# Patient Record
Sex: Male | Born: 1960 | Race: White | Hispanic: No | Marital: Single
Health system: Southern US, Community
[De-identification: ages and names within clinical notes are randomized; demographics above are authoritative.]

---

## 2017-09-11 DIAGNOSIS — H903 Sensorineural hearing loss, bilateral: Secondary | ICD-10-CM | POA: Insufficient documentation

## 2018-10-22 ENCOUNTER — Other Ambulatory Visit: Payer: Self-pay | Admitting: Gastroenterology

## 2018-10-22 DIAGNOSIS — R634 Abnormal weight loss: Secondary | ICD-10-CM

## 2018-10-22 DIAGNOSIS — R194 Change in bowel habit: Secondary | ICD-10-CM

## 2018-11-05 ENCOUNTER — Ambulatory Visit
Admission: RE | Admit: 2018-11-05 | Discharge: 2018-11-05 | Disposition: A | Discharge status: Home / Self Care | Payer: Self-pay | Source: Ambulatory Visit | Attending: Gastroenterology | Admitting: Gastroenterology

## 2018-11-05 DIAGNOSIS — R194 Change in bowel habit: Secondary | ICD-10-CM

## 2018-11-05 DIAGNOSIS — R634 Abnormal weight loss: Secondary | ICD-10-CM

## 2018-11-05 MED ORDER — IOPAMIDOL (ISOVUE-300) INJECTION 61%
100.0000 mL | Freq: Once | INTRAVENOUS | Status: AC | PRN
  Administered 2018-11-05: 100 mL via INTRAVENOUS

## 2021-10-09 ENCOUNTER — Emergency Department (HOSPITAL_COMMUNITY): Payer: 59

## 2021-10-09 ENCOUNTER — Encounter (HOSPITAL_COMMUNITY): Payer: Self-pay | Admitting: Emergency Medicine

## 2021-10-09 ENCOUNTER — Inpatient Hospital Stay (HOSPITAL_COMMUNITY)
Admission: EM | Admit: 2021-10-09 | Discharge: 2021-11-18 | DRG: 870 | Disposition: E | Payer: 59 | Attending: Pulmonary Disease | Admitting: Pulmonary Disease

## 2021-10-09 ENCOUNTER — Other Ambulatory Visit: Payer: Self-pay

## 2021-10-09 DIAGNOSIS — A4152 Sepsis due to Pseudomonas: Principal | ICD-10-CM | POA: Diagnosis present

## 2021-10-09 DIAGNOSIS — L89140 Pressure ulcer of left lower back, unstageable: Secondary | ICD-10-CM | POA: Diagnosis present

## 2021-10-09 DIAGNOSIS — J1282 Pneumonia due to coronavirus disease 2019: Secondary | ICD-10-CM | POA: Diagnosis present

## 2021-10-09 DIAGNOSIS — E876 Hypokalemia: Secondary | ICD-10-CM | POA: Diagnosis present

## 2021-10-09 DIAGNOSIS — J982 Interstitial emphysema: Secondary | ICD-10-CM | POA: Diagnosis not present

## 2021-10-09 DIAGNOSIS — R680 Hypothermia, not associated with low environmental temperature: Secondary | ICD-10-CM | POA: Diagnosis present

## 2021-10-09 DIAGNOSIS — J80 Acute respiratory distress syndrome: Secondary | ICD-10-CM | POA: Diagnosis present

## 2021-10-09 DIAGNOSIS — N179 Acute kidney failure, unspecified: Secondary | ICD-10-CM

## 2021-10-09 DIAGNOSIS — D62 Acute posthemorrhagic anemia: Secondary | ICD-10-CM | POA: Diagnosis not present

## 2021-10-09 DIAGNOSIS — K626 Ulcer of anus and rectum: Secondary | ICD-10-CM | POA: Diagnosis present

## 2021-10-09 DIAGNOSIS — R54 Age-related physical debility: Secondary | ICD-10-CM | POA: Diagnosis present

## 2021-10-09 DIAGNOSIS — R0902 Hypoxemia: Secondary | ICD-10-CM

## 2021-10-09 DIAGNOSIS — Z452 Encounter for adjustment and management of vascular access device: Secondary | ICD-10-CM

## 2021-10-09 DIAGNOSIS — T797XXA Traumatic subcutaneous emphysema, initial encounter: Secondary | ICD-10-CM

## 2021-10-09 DIAGNOSIS — G6281 Critical illness polyneuropathy: Secondary | ICD-10-CM | POA: Diagnosis present

## 2021-10-09 DIAGNOSIS — K5900 Constipation, unspecified: Secondary | ICD-10-CM | POA: Diagnosis present

## 2021-10-09 DIAGNOSIS — L89896 Pressure-induced deep tissue damage of other site: Secondary | ICD-10-CM | POA: Diagnosis present

## 2021-10-09 DIAGNOSIS — R64 Cachexia: Secondary | ICD-10-CM | POA: Diagnosis present

## 2021-10-09 DIAGNOSIS — D6959 Other secondary thrombocytopenia: Secondary | ICD-10-CM | POA: Diagnosis present

## 2021-10-09 DIAGNOSIS — E872 Acidosis, unspecified: Secondary | ICD-10-CM

## 2021-10-09 DIAGNOSIS — J9601 Acute respiratory failure with hypoxia: Secondary | ICD-10-CM | POA: Diagnosis present

## 2021-10-09 DIAGNOSIS — E861 Hypovolemia: Secondary | ICD-10-CM | POA: Diagnosis present

## 2021-10-09 DIAGNOSIS — L8990 Pressure ulcer of unspecified site, unspecified stage: Secondary | ICD-10-CM | POA: Diagnosis present

## 2021-10-09 DIAGNOSIS — R0600 Dyspnea, unspecified: Secondary | ICD-10-CM

## 2021-10-09 DIAGNOSIS — Z515 Encounter for palliative care: Secondary | ICD-10-CM

## 2021-10-09 DIAGNOSIS — J189 Pneumonia, unspecified organism: Secondary | ICD-10-CM

## 2021-10-09 DIAGNOSIS — R0989 Other specified symptoms and signs involving the circulatory and respiratory systems: Secondary | ICD-10-CM

## 2021-10-09 DIAGNOSIS — Z66 Do not resuscitate: Secondary | ICD-10-CM | POA: Diagnosis not present

## 2021-10-09 DIAGNOSIS — R6520 Severe sepsis without septic shock: Secondary | ICD-10-CM | POA: Diagnosis present

## 2021-10-09 DIAGNOSIS — I712 Thoracic aortic aneurysm, without rupture, unspecified: Secondary | ICD-10-CM | POA: Diagnosis present

## 2021-10-09 DIAGNOSIS — T7401XA Adult neglect or abandonment, confirmed, initial encounter: Secondary | ICD-10-CM

## 2021-10-09 DIAGNOSIS — Z4659 Encounter for fitting and adjustment of other gastrointestinal appliance and device: Secondary | ICD-10-CM

## 2021-10-09 DIAGNOSIS — A4189 Other specified sepsis: Secondary | ICD-10-CM | POA: Diagnosis present

## 2021-10-09 DIAGNOSIS — U071 COVID-19: Secondary | ICD-10-CM

## 2021-10-09 DIAGNOSIS — R7989 Other specified abnormal findings of blood chemistry: Secondary | ICD-10-CM

## 2021-10-09 DIAGNOSIS — L89220 Pressure ulcer of left hip, unstageable: Secondary | ICD-10-CM | POA: Diagnosis present

## 2021-10-09 DIAGNOSIS — A419 Sepsis, unspecified organism: Secondary | ICD-10-CM | POA: Diagnosis present

## 2021-10-09 DIAGNOSIS — D689 Coagulation defect, unspecified: Secondary | ICD-10-CM | POA: Diagnosis present

## 2021-10-09 DIAGNOSIS — E43 Unspecified severe protein-calorie malnutrition: Secondary | ICD-10-CM | POA: Diagnosis present

## 2021-10-09 DIAGNOSIS — L89150 Pressure ulcer of sacral region, unstageable: Secondary | ICD-10-CM | POA: Diagnosis present

## 2021-10-09 DIAGNOSIS — G9341 Metabolic encephalopathy: Secondary | ICD-10-CM | POA: Diagnosis present

## 2021-10-09 DIAGNOSIS — Z4931 Encounter for adequacy testing for hemodialysis: Secondary | ICD-10-CM

## 2021-10-09 DIAGNOSIS — I219 Acute myocardial infarction, unspecified: Secondary | ICD-10-CM | POA: Diagnosis not present

## 2021-10-09 DIAGNOSIS — L89212 Pressure ulcer of right hip, stage 2: Secondary | ICD-10-CM | POA: Diagnosis present

## 2021-10-09 DIAGNOSIS — K922 Gastrointestinal hemorrhage, unspecified: Secondary | ICD-10-CM | POA: Diagnosis present

## 2021-10-09 DIAGNOSIS — J9600 Acute respiratory failure, unspecified whether with hypoxia or hypercapnia: Secondary | ICD-10-CM

## 2021-10-09 DIAGNOSIS — J440 Chronic obstructive pulmonary disease with acute lower respiratory infection: Secondary | ICD-10-CM | POA: Diagnosis present

## 2021-10-09 DIAGNOSIS — L89130 Pressure ulcer of right lower back, unstageable: Secondary | ICD-10-CM | POA: Diagnosis present

## 2021-10-09 DIAGNOSIS — R627 Adult failure to thrive: Secondary | ICD-10-CM | POA: Diagnosis present

## 2021-10-09 DIAGNOSIS — Z681 Body mass index (BMI) 19 or less, adult: Secondary | ICD-10-CM

## 2021-10-09 DIAGNOSIS — R6521 Severe sepsis with septic shock: Secondary | ICD-10-CM | POA: Diagnosis not present

## 2021-10-09 DIAGNOSIS — E860 Dehydration: Secondary | ICD-10-CM | POA: Diagnosis present

## 2021-10-09 DIAGNOSIS — K921 Melena: Secondary | ICD-10-CM | POA: Diagnosis not present

## 2021-10-09 DIAGNOSIS — Z28310 Unvaccinated for covid-19: Secondary | ICD-10-CM

## 2021-10-09 DIAGNOSIS — J151 Pneumonia due to Pseudomonas: Secondary | ICD-10-CM | POA: Diagnosis present

## 2021-10-09 DIAGNOSIS — E870 Hyperosmolality and hypernatremia: Secondary | ICD-10-CM

## 2021-10-09 DIAGNOSIS — Z9289 Personal history of other medical treatment: Secondary | ICD-10-CM

## 2021-10-09 DIAGNOSIS — Z978 Presence of other specified devices: Secondary | ICD-10-CM

## 2021-10-09 LAB — CBC WITH DIFFERENTIAL/PLATELET
Abs Immature Granulocytes: 0.33 10*3/uL — ABNORMAL HIGH (ref 0.00–0.07)
Basophils Absolute: 0.2 10*3/uL — ABNORMAL HIGH (ref 0.0–0.1)
Basophils Relative: 1 %
Eosinophils Absolute: 0 10*3/uL (ref 0.0–0.5)
Eosinophils Relative: 0 %
HCT: 56.1 % — ABNORMAL HIGH (ref 39.0–52.0)
Hemoglobin: 18.6 g/dL — ABNORMAL HIGH (ref 13.0–17.0)
Immature Granulocytes: 1 %
Lymphocytes Relative: 3 %
Lymphs Abs: 0.8 10*3/uL (ref 0.7–4.0)
MCH: 29.9 pg (ref 26.0–34.0)
MCHC: 33.2 g/dL (ref 30.0–36.0)
MCV: 90.2 fL (ref 80.0–100.0)
Monocytes Absolute: 1.5 10*3/uL — ABNORMAL HIGH (ref 0.1–1.0)
Monocytes Relative: 6 %
Neutro Abs: 22.7 10*3/uL — ABNORMAL HIGH (ref 1.7–7.7)
Neutrophils Relative %: 89 %
Platelets: 623 10*3/uL — ABNORMAL HIGH (ref 150–400)
RBC: 6.22 MIL/uL — ABNORMAL HIGH (ref 4.22–5.81)
RDW: 16.3 % — ABNORMAL HIGH (ref 11.5–15.5)
WBC: 25.5 10*3/uL — ABNORMAL HIGH (ref 4.0–10.5)
nRBC: 0.1 % (ref 0.0–0.2)

## 2021-10-09 LAB — RAPID URINE DRUG SCREEN, HOSP PERFORMED
Amphetamines: NOT DETECTED
Barbiturates: NOT DETECTED
Benzodiazepines: NOT DETECTED
Cocaine: NOT DETECTED
Opiates: NOT DETECTED
Tetrahydrocannabinol: NOT DETECTED

## 2021-10-09 LAB — MAGNESIUM: Magnesium: 3.7 mg/dL — ABNORMAL HIGH (ref 1.7–2.4)

## 2021-10-09 LAB — LACTIC ACID, PLASMA
Lactic Acid, Venous: 7.7 mmol/L (ref 0.5–1.9)
Lactic Acid, Venous: 6.4 mmol/L (ref 0.5–1.9)
Lactic Acid, Venous: 3.5 mmol/L (ref 0.5–1.9)

## 2021-10-09 LAB — PROTIME-INR
INR: 1.3 — ABNORMAL HIGH (ref 0.8–1.2)
Prothrombin Time: 16.5 seconds — ABNORMAL HIGH (ref 11.4–15.2)

## 2021-10-09 LAB — RESP PANEL BY RT-PCR (FLU A&B, COVID) ARPGX2
Influenza A by PCR: NEGATIVE
Influenza B by PCR: NEGATIVE
SARS Coronavirus 2 by RT PCR: POSITIVE — AB

## 2021-10-09 LAB — COMPREHENSIVE METABOLIC PANEL
ALT: 89 U/L — ABNORMAL HIGH (ref 0–44)
AST: 213 U/L — ABNORMAL HIGH (ref 15–41)
Albumin: 3.3 g/dL — ABNORMAL LOW (ref 3.5–5.0)
Alkaline Phosphatase: 86 U/L (ref 38–126)
Anion gap: 23 — ABNORMAL HIGH (ref 5–15)
BUN: 126 mg/dL — ABNORMAL HIGH (ref 8–23)
CO2: 14 mmol/L — ABNORMAL LOW (ref 22–32)
Calcium: 9.7 mg/dL (ref 8.9–10.3)
Chloride: 114 mmol/L — ABNORMAL HIGH (ref 98–111)
Creatinine, Ser: 3.54 mg/dL — ABNORMAL HIGH (ref 0.61–1.24)
GFR, Estimated: 19 mL/min — ABNORMAL LOW (ref >60)
Glucose, Bld: 142 mg/dL — ABNORMAL HIGH (ref 70–99)
Potassium: 4.5 mmol/L (ref 3.5–5.1)
Sodium: 151 mmol/L — ABNORMAL HIGH (ref 135–145)
Total Bilirubin: 1.7 mg/dL — ABNORMAL HIGH (ref 0.3–1.2)
Total Protein: 7.8 g/dL (ref 6.5–8.1)

## 2021-10-09 LAB — URINALYSIS, ROUTINE W REFLEX MICROSCOPIC
Bacteria, UA: NONE SEEN
Bilirubin Urine: NEGATIVE
Glucose, UA: NEGATIVE mg/dL
Ketones, ur: NEGATIVE mg/dL
Leukocytes,Ua: NEGATIVE
Nitrite: NEGATIVE
Protein, ur: NEGATIVE mg/dL
Specific Gravity, Urine: 1.02 (ref 1.005–1.030)
pH: 5 (ref 5.0–8.0)

## 2021-10-09 LAB — BRAIN NATRIURETIC PEPTIDE: B Natriuretic Peptide: 165.3 pg/mL — ABNORMAL HIGH (ref 0.0–100.0)

## 2021-10-09 LAB — D-DIMER, QUANTITATIVE: D-Dimer, Quant: 4.97 ug/mL-FEU — ABNORMAL HIGH (ref 0.00–0.50)

## 2021-10-09 MED ORDER — VANCOMYCIN HCL IN DEXTROSE 1-5 GM/200ML-% IV SOLN
1000.0000 mg | Freq: Once | INTRAVENOUS | Status: AC
  Administered 2021-10-09: 1000 mg via INTRAVENOUS
  Filled 2021-10-09: qty 200

## 2021-10-09 MED ORDER — LACTATED RINGERS IV SOLN: INTRAVENOUS | Status: DC

## 2021-10-09 MED ORDER — SODIUM CHLORIDE 0.9 % IV SOLN
2.0000 g | Freq: Once | INTRAVENOUS | Status: AC
  Administered 2021-10-09: 2 g via INTRAVENOUS
  Filled 2021-10-09: qty 2

## 2021-10-09 MED ORDER — LACTATED RINGERS IV BOLUS (SEPSIS)
250.0000 mL | Freq: Once | INTRAVENOUS | Status: AC
  Administered 2021-10-09: 250 mL via INTRAVENOUS

## 2021-10-09 MED ORDER — METRONIDAZOLE 500 MG/100ML IV SOLN
500.0000 mg | Freq: Once | INTRAVENOUS | Status: AC
  Administered 2021-10-09: 500 mg via INTRAVENOUS
  Filled 2021-10-09: qty 100

## 2021-10-09 MED ORDER — LACTATED RINGERS IV BOLUS (SEPSIS)
1000.0000 mL | Freq: Once | INTRAVENOUS | Status: AC
  Administered 2021-10-09: 1000 mL via INTRAVENOUS

## 2021-10-09 MED ORDER — LACTATED RINGERS IV BOLUS (SEPSIS)
500.0000 mL | Freq: Once | INTRAVENOUS | Status: AC
  Administered 2021-10-09: 500 mL via INTRAVENOUS

## 2021-10-09 NOTE — ED Notes (Signed)
Patients family member called for an update: [name] [phone]

## 2021-10-09 NOTE — ED Provider Notes (Signed)
[facility] DEPT Provider Note   CSN: [id number] Arrival date & time: 09/29/2021  1757     History  Chief Complaint  Patient presents with   Code Sepsis    Brandon Bond is a 61 y.o. male.  HPI Patient presents for generalized weakness and altered mental status.  Medical history includes anxiety and depression.  He had recent exposure to COVID-19.  He arrives in the ED via EMS from home.  EMS noted a disheveled home environment.  Patient was found in a recliner soiled in urine and stool.  His partner, who he lives with, stated that he was up and walking yesterday.  Initial vital signs on scene were notable for SPO2 in the 70s on room air, tachypnea, tachycardia, and normal blood pressure.  He was given albuterol and IV fluids during transit.  Patient is unable to provide any further history due to altered mental status.     Home Medications Prior to Admission medications   Not on File      Allergies    Patient has no known allergies.    Review of Systems   Review of Systems  Unable to perform ROS: Mental status change   Physical Exam Updated Vital Signs BP (!) 126/97    Pulse 76    Temp 98 F (36.7 C)    Resp (!) 26    Ht 5\' 11"  (1.803 m)    Wt 63.5 kg    SpO2 92% Comment: Respiratory at bedside setting up humidified oxygen   BMI 19.53 kg/m  Physical Exam Constitutional:      General: He is awake.     Appearance: He is cachectic. He is ill-appearing.     Comments: Disheveled, soiled in urine and stool  HENT:     Head: Atraumatic.     Right Ear: External ear normal.     Left Ear: External ear normal.     Nose: Congestion and rhinorrhea present.     Mouth/Throat:     Mouth: Mucous membranes are dry.     Pharynx: Oropharynx is clear.  Eyes:     General: No scleral icterus. Cardiovascular:     Rate and Rhythm: Regular rhythm. Tachycardia present.  Pulmonary:     Effort: Tachypnea present.     Breath sounds: Transmitted upper airway  sounds present. No stridor. Rhonchi present. No decreased breath sounds, wheezing or rales.  Abdominal:     General: Abdomen is flat.     Palpations: Abdomen is soft.     Tenderness: There is no abdominal tenderness.  Musculoskeletal:        General: No deformity.     Cervical back: No rigidity or tenderness.     Right lower leg: Edema present.     Left lower leg: Edema present.  Skin:    Comments: Redness and tenderness to lower half of back, hips, and medial/posterior elbows, consistent with contact dermatitis from laying in soiled recliner  Neurological:     GCS: GCS eye subscore is 4. GCS verbal subscore is 4. GCS motor subscore is 6.     Cranial Nerves: No facial asymmetry.  Psychiatric:        Mood and Affect: Mood is anxious.        Behavior: Behavior is uncooperative and agitated.    ED Results / Procedures / Treatments   Labs (all labs ordered are listed, but only abnormal results are displayed) Labs Reviewed  RESP PANEL BY RT-PCR (FLU A&B,  COVID) ARPGX2 - Abnormal; Notable for the following components:      Result Value   SARS Coronavirus 2 by RT PCR POSITIVE (*)    All other components within normal limits  LACTIC ACID, PLASMA - Abnormal; Notable for the following components:   Lactic Acid, Venous 7.7 (*)    All other components within normal limits  LACTIC ACID, PLASMA - Abnormal; Notable for the following components:   Lactic Acid, Venous 6.4 (*)    All other components within normal limits  COMPREHENSIVE METABOLIC PANEL - Abnormal; Notable for the following components:   Sodium 151 (*)    Chloride 114 (*)    CO2 14 (*)    Glucose, Bld 142 (*)    BUN 126 (*)    Creatinine, Ser 3.54 (*)    Albumin 3.3 (*)    AST 213 (*)    ALT 89 (*)    Total Bilirubin 1.7 (*)    GFR, Estimated 19 (*)    Anion gap 23 (*)    All other components within normal limits  CBC WITH DIFFERENTIAL/PLATELET - Abnormal; Notable for the following components:   WBC 25.5 (*)    RBC 6.22  (*)    Hemoglobin 18.6 (*)    HCT 56.1 (*)    RDW 16.3 (*)    Platelets 623 (*)    Neutro Abs 22.7 (*)    Monocytes Absolute 1.5 (*)    Basophils Absolute 0.2 (*)    Abs Immature Granulocytes 0.33 (*)    All other components within normal limits  PROTIME-INR - Abnormal; Notable for the following components:   Prothrombin Time 16.5 (*)    INR 1.3 (*)    All other components within normal limits  URINALYSIS, ROUTINE W REFLEX MICROSCOPIC - Abnormal; Notable for the following components:   Color, Urine AMBER (*)    Hgb urine dipstick SMALL (*)    All other components within normal limits  BRAIN NATRIURETIC PEPTIDE - Abnormal; Notable for the following components:   B Natriuretic Peptide 165.3 (*)    All other components within normal limits  D-DIMER, QUANTITATIVE - Abnormal; Notable for the following components:   D-Dimer, Quant 4.97 (*)    All other components within normal limits  MAGNESIUM - Abnormal; Notable for the following components:   Magnesium 3.7 (*)    All other components within normal limits  LACTIC ACID, PLASMA - Abnormal; Notable for the following components:   Lactic Acid, Venous 3.5 (*)    All other components within normal limits  LACTIC ACID, PLASMA - Abnormal; Notable for the following components:   Lactic Acid, Venous 2.5 (*)    All other components within normal limits  CULTURE, BLOOD (ROUTINE X 2)  CULTURE, BLOOD (ROUTINE X 2)  URINE CULTURE  RAPID URINE DRUG SCREEN, HOSP PERFORMED  HIV ANTIBODY (ROUTINE TESTING W REFLEX)  COMPREHENSIVE METABOLIC PANEL  CBC WITH DIFFERENTIAL/PLATELET  MAGNESIUM    EKG EKG Interpretation  Date/Time:  Monday October 09 2021 18:25:14 EST Ventricular Rate:  118 PR Interval:  167 QRS Duration: 92 QT Interval:  306 QTC Calculation: 429 R Axis:   77 Text Interpretation: Sinus tachycardia Biatrial enlargement RSR' in V1 or V2, probably normal variant Probable inferior infarct, old Confirmed by Godfrey Pick [phone]) on  10/14/2021 6:58:10 PM  Radiology CT Head Wo Contrast  Result Date: 10/13/2021 CLINICAL DATA:  Altered mental status EXAM: CT HEAD WITHOUT CONTRAST TECHNIQUE: Contiguous axial images were obtained from the base  of the skull through the vertex without intravenous contrast. RADIATION DOSE REDUCTION: This exam was performed according to the departmental dose-optimization program which includes automated exposure control, adjustment of the mA and/or kV according to patient size and/or use of iterative reconstruction technique. COMPARISON:  None. FINDINGS: Brain: No evidence of acute infarction, hemorrhage, hydrocephalus, extra-axial collection or mass lesion/mass effect. Vascular: No hyperdense vessel or unexpected calcification. Skull: Normal. Negative for fracture or focal lesion. Sinuses/Orbits: No acute finding. Other: None. IMPRESSION: No acute intracranial abnormality noted. Electronically Signed   By: Inez Catalina M.D.   On: 10/17/2021 19:16   DG Chest Port 1 View  Result Date: 10/07/2021 CLINICAL DATA:  Possible sepsis EXAM: PORTABLE CHEST 1 VIEW COMPARISON:  03/22/2009 FINDINGS: Cardiac shadow is within normal limits. The lungs are well aerated bilaterally. Bibasilar medial airspace opacities are noted distant with multifocal pneumonia. No sizable effusion is seen. No bony abnormality is noted. IMPRESSION: Bibasilar infiltrates Electronically Signed   By: Inez Catalina M.D.   On: 10/05/2021 19:27   CT CHEST ABDOMEN PELVIS WO CONTRAST  Result Date: 10/12/2021 CLINICAL DATA:  Possible sepsis, bibasilar infiltrates on recent chest x-ray EXAM: CT CHEST, ABDOMEN AND PELVIS WITHOUT CONTRAST TECHNIQUE: Multidetector CT imaging of the chest, abdomen and pelvis was performed following the standard protocol without IV contrast. RADIATION DOSE REDUCTION: This exam was performed according to the departmental dose-optimization program which includes automated exposure control, adjustment of the mA and/or kV  according to patient size and/or use of iterative reconstruction technique. COMPARISON:  Chest x-ray from earlier in the same day. FINDINGS: CT CHEST FINDINGS Cardiovascular: Limited due to lack of IV contrast. Atherosclerotic calcifications of the thoracic aorta are noted. Ascending aorta is mildly prominent at 4.4 cm. Coronary calcifications are noted. No cardiac enlargement is noted. Mediastinum/Nodes: Thoracic inlet is within normal limits. No sizable hilar or mediastinal adenopathy is noted. The esophagus is within normal limits. Lungs/Pleura: Lungs are well aerated bilaterally with the exception of bilateral lower lobe infiltrates similar to that seen on prior chest x-ray. No effusions are noted. No parenchymal nodules are seen. Musculoskeletal: Degenerative changes of the thoracic spine are noted. CT ABDOMEN PELVIS FINDINGS Hepatobiliary: No focal liver abnormality is seen. No gallstones, gallbladder wall thickening, or biliary dilatation. Pancreas: Unremarkable. No pancreatic ductal dilatation or surrounding inflammatory changes. Spleen: Normal in size without focal abnormality. Adrenals/Urinary Tract: Adrenal glands are within normal limits. Kidneys demonstrate no renal calculi or obstructive changes. Bladder is decompressed by Foley catheter. Stomach/Bowel: The appendix is within normal limits. No obstructive or inflammatory changes of the colon are noted. Fecal material is noted throughout the colon particularly in the rectal vault which may represent some early constipation. Small bowel and stomach are within normal limits. Vascular/Lymphatic: Aortic atherosclerosis. No enlarged abdominal or pelvic lymph nodes. Reproductive: Prostate is unremarkable. Other: No abdominal wall hernia or abnormality. No abdominopelvic ascites. Musculoskeletal: Degenerative changes of lumbar spine are noted. IMPRESSION: CT of the chest: Bilateral lower lobe infiltrates similar to that seen on recent chest x-ray. These  changes may be related to aspiration. Prominent thoracic aortic aneurysm at 4.4 cm. Recommend annual imaging followup by CTA or MRA. This recommendation follows 2010 ACCF/AHA/AATS/ACR/ASA/SCA/SCAI/SIR/STS/SVM Guidelines for the Diagnosis and Management of Patients with Thoracic Aortic Disease. Circulation. 2010; [id number]. Aortic aneurysm NOS (ICD10-I71.9) CT of the abdomen and pelvis: Mild fecal retention particularly in the rectal vault. No other focal abnormality is noted. Electronically Signed   By: Inez Catalina M.D.   On: 10/06/2021  22:37    Procedures Procedures    Medications Ordered in ED Medications  dexamethasone (DECADRON) injection 6 mg (6 mg Intravenous Given 10/10/21 0100)  dextrose 5 % solution ( Intravenous New Bag/Given 10/10/21 0104)  heparin injection 5,000 Units (5,000 Units Subcutaneous Given 10/10/21 0105)  acetaminophen (TYLENOL) suppository 650 mg (has no administration in time range)  ondansetron (ZOFRAN) injection 4 mg (has no administration in time range)  remdesivir 100 mg in sodium chloride 0.9 % 100 mL IVPB (100 mg Intravenous New Bag/Given 10/10/21 0055)    Followed by  remdesivir 100 mg in sodium chloride 0.9 % 100 mL IVPB (has no administration in time range)    Followed by  remdesivir 100 mg in sodium chloride 0.9 % 100 mL IVPB (has no administration in time range)  ceFEPIme (MAXIPIME) 2 g in sodium chloride 0.9 % 100 mL IVPB (has no administration in time range)  vancomycin variable dose per unstable renal function (pharmacist dosing) (has no administration in time range)  lactated ringers bolus 1,000 mL (0 mLs Intravenous Stopped 09/25/2021 2010)    And  lactated ringers bolus 500 mL (0 mLs Intravenous Stopped 10/12/2021 2336)    And  lactated ringers bolus 250 mL (0 mLs Intravenous Stopped 09/27/2021 2336)  ceFEPIme (MAXIPIME) 2 g in sodium chloride 0.9 % 100 mL IVPB (0 g Intravenous Stopped 09/24/2021 1915)  metroNIDAZOLE (FLAGYL) IVPB 500 mg (0 mg Intravenous  Stopped 09/28/2021 1946)  vancomycin (VANCOCIN) IVPB 1000 mg/200 mL premix (0 mg Intravenous Stopped 09/30/2021 1959)    ED Course/ Medical Decision Making/ A&P                           Medical Decision Making Amount and/or Complexity of Data Reviewed Labs: ordered. Radiology: ordered. ECG/medicine tests: ordered.  Risk Prescription drug management. Decision regarding hospitalization.   This patient presents to the ED for concern of altered mental status, this involves an extensive number of treatment options, and is a complaint that carries with it a high risk of complications and morbidity.  The differential diagnosis includes sepsis, CVA, polypharmacy, drug withdrawal, uremia, dehydration   Co morbidities that complicate the patient evaluation  Anxiety, depression   Additional history obtained:  Additional history obtained from EMS External records from outside source obtained and reviewed including EMR   Lab Tests:  I Ordered, and personally interpreted labs.  The pertinent results include: AKI, severe azotemia, metabolic acidosis, lactic acidosis, hypernatremia, leukocytosis, polycythemia and thrombocytosis consistent with hemoconcentration, elevated D-dimer, COVID-19 positive   Imaging Studies ordered:  I ordered imaging studies including chest x-ray, CT head, CT of chest, abdomen, and pelvis I independently visualized and interpreted imaging which showed the following: No acute abnormalities identified on CT head.  CT of chest/abdomen/pelvis showed bilateral lower lung lobe infiltrates I agree with the radiologist interpretation   Cardiac Monitoring:  The patient was maintained on a cardiac monitor.  I personally viewed and interpreted the cardiac monitored which showed an underlying rhythm of: Sinus rhythm   Medicines ordered and prescription drug management:  I ordered medication including IV fluids and broad-spectrum antibiotics for empiric treatment of  sepsis Reevaluation of the patient after these medicines showed that the patient improved I have reviewed the patients home medicines and have made adjustments as needed  Critical Interventions:  IV fluids and broad-spectrum antibiotics for empiric treatment of sepsis  Problem List / ED Course:  61 year old male presenting for altered mental status.  On arrival, patient is severely ill-appearing.  He is soiled in stool and urine.  He has areas of redness on his back and medial elbows that I suspect is contact dermatitis from prolonged sitting in his own urine.  He has pressure ulcers to his backside.  There is a small ulceration of his scrotum.  He is disheveled and cachectic.  His vital signs are notable for hypothermia, tachycardia, hypoxia, tachypnea.  He is maintaining normal blood pressures.  Patient was cleaned on arrival.  He was placed on supplemental oxygen.  IV fluids and broad-spectrum antibiotics were given for empiric treatment of sepsis.  With IV fluid resuscitation, patient's vital signs improved.  He was weaned down to 3 L of supplemental oxygen.  Although patient does not have prior lab work for comparison, lab work today is consistent with prerenal AKI with markedly elevated BUN, which is likely, in part, contributing to his altered mental state.  CMP also notable for hyponatremia and elevated liver enzymes.  CBC shows elevation in WBC, hemoglobin, and platelets, consistent with hemoconcentration.  Patient's initial lactic acid was 7.7, consistent with severe illness.  He is found to be COVID-19 positive.  Patient underwent imaging studies which was notable for bibasilar lung infiltrates.  I spoke with his partner, who he lives with.  She did reiterate that he was up and walking yesterday.  She states that he was in his recliner since last night.  She states that he has not left the house in several months due to what she suspects is severe depression.  He has not been taking any  medications.  He does have a history of alcohol use but has not drank in years.  He does not use illicit drugs.   Reevaluation:  After the interventions noted above, I reevaluated the patient and found that they have :improved   Social Determinants of Health:  History of depression, malnutrition, uncertain social situation   Dispostion:  After consideration of the diagnostic results and the patients response to treatment, I feel that the patent would benefit from admission.   CRITICAL CARE Performed by: Godfrey Pick   Total critical care time: 42 minutes  Critical care time was exclusive of separately billable procedures and treating other patients.  Critical care was necessary to treat or prevent imminent or life-threatening deterioration.  Critical care was time spent personally by me on the following activities: development of treatment plan with patient and/or surrogate as well as nursing, discussions with consultants, evaluation of patient's response to treatment, examination of patient, obtaining history from patient or surrogate, ordering and performing treatments and interventions, ordering and review of laboratory studies, ordering and review of radiographic studies, pulse oximetry and re-evaluation of patient's condition.         Final Clinical Impression(s) / ED Diagnoses Final diagnoses:  AKI (acute kidney injury) ([facility])  Azotemia  Lactic acidosis  COVID-19  Hypernatremia    Rx / DC Orders ED Discharge Orders     None         Godfrey Pick, MD 10/10/21 [id number]

## 2021-10-09 NOTE — ED Triage Notes (Signed)
Patient presents from home due to not eating moving and altered mental status the past few days. Patient admits to being Covid positive for 2 days. Rn noted sore all over his body as well as malodorous urine. EMS reports wheezing in the right lung and sats in the 80's. 5 of albuterol was given as well as 300 ml of fluid. Post treatment SPO2 increased to 92%.  HX depression, anxiety  EMS vitals: 105/80 BP 120 HR 32 RR 188 CBG 92 %  SPO2 on room air

## 2021-10-09 NOTE — Progress Notes (Signed)
Elink following code sepsis °

## 2021-10-09 NOTE — Progress Notes (Signed)
A consult was received from an ED physician for Vancomycin + Cefepime per pharmacy dosing.  The patient's profile has been reviewed for ht/wt/allergies/indication/available labs.   A one time order has been placed for Cefepime 2gm IV & Vancomycin 1gm IV.  Further antibiotics/pharmacy consults should be ordered by admitting physician if indicated.                       Thank you, Junita Push PharmD 10/08/2021  6:15 PM

## 2021-10-10 ENCOUNTER — Encounter (HOSPITAL_COMMUNITY): Payer: Self-pay | Admitting: Internal Medicine

## 2021-10-10 ENCOUNTER — Other Ambulatory Visit: Payer: Self-pay

## 2021-10-10 DIAGNOSIS — E872 Acidosis, unspecified: Secondary | ICD-10-CM | POA: Diagnosis present

## 2021-10-10 DIAGNOSIS — A419 Sepsis, unspecified organism: Secondary | ICD-10-CM

## 2021-10-10 DIAGNOSIS — E870 Hyperosmolality and hypernatremia: Secondary | ICD-10-CM | POA: Diagnosis present

## 2021-10-10 DIAGNOSIS — R6521 Severe sepsis with septic shock: Secondary | ICD-10-CM | POA: Diagnosis not present

## 2021-10-10 DIAGNOSIS — J440 Chronic obstructive pulmonary disease with acute lower respiratory infection: Secondary | ICD-10-CM | POA: Diagnosis present

## 2021-10-10 DIAGNOSIS — U071 COVID-19: Secondary | ICD-10-CM | POA: Diagnosis not present

## 2021-10-10 DIAGNOSIS — J151 Pneumonia due to Pseudomonas: Secondary | ICD-10-CM | POA: Diagnosis present

## 2021-10-10 DIAGNOSIS — Z681 Body mass index (BMI) 19 or less, adult: Secondary | ICD-10-CM | POA: Diagnosis not present

## 2021-10-10 DIAGNOSIS — R6520 Severe sepsis without septic shock: Secondary | ICD-10-CM | POA: Diagnosis not present

## 2021-10-10 DIAGNOSIS — R7401 Elevation of levels of liver transaminase levels: Secondary | ICD-10-CM | POA: Diagnosis not present

## 2021-10-10 DIAGNOSIS — E43 Unspecified severe protein-calorie malnutrition: Secondary | ICD-10-CM | POA: Diagnosis not present

## 2021-10-10 DIAGNOSIS — J80 Acute respiratory distress syndrome: Secondary | ICD-10-CM | POA: Diagnosis present

## 2021-10-10 DIAGNOSIS — J189 Pneumonia, unspecified organism: Secondary | ICD-10-CM | POA: Diagnosis not present

## 2021-10-10 DIAGNOSIS — I712 Thoracic aortic aneurysm, without rupture, unspecified: Secondary | ICD-10-CM | POA: Diagnosis present

## 2021-10-10 DIAGNOSIS — D689 Coagulation defect, unspecified: Secondary | ICD-10-CM | POA: Diagnosis present

## 2021-10-10 DIAGNOSIS — J9600 Acute respiratory failure, unspecified whether with hypoxia or hypercapnia: Secondary | ICD-10-CM | POA: Diagnosis not present

## 2021-10-10 DIAGNOSIS — G6281 Critical illness polyneuropathy: Secondary | ICD-10-CM | POA: Diagnosis present

## 2021-10-10 DIAGNOSIS — G9341 Metabolic encephalopathy: Secondary | ICD-10-CM | POA: Diagnosis present

## 2021-10-10 DIAGNOSIS — R64 Cachexia: Secondary | ICD-10-CM | POA: Diagnosis present

## 2021-10-10 DIAGNOSIS — T7401XA Adult neglect or abandonment, confirmed, initial encounter: Secondary | ICD-10-CM | POA: Diagnosis not present

## 2021-10-10 DIAGNOSIS — Z515 Encounter for palliative care: Secondary | ICD-10-CM | POA: Diagnosis not present

## 2021-10-10 DIAGNOSIS — E860 Dehydration: Secondary | ICD-10-CM | POA: Insufficient documentation

## 2021-10-10 DIAGNOSIS — A4152 Sepsis due to Pseudomonas: Secondary | ICD-10-CM | POA: Diagnosis present

## 2021-10-10 DIAGNOSIS — J9601 Acute respiratory failure with hypoxia: Secondary | ICD-10-CM | POA: Diagnosis not present

## 2021-10-10 DIAGNOSIS — R627 Adult failure to thrive: Secondary | ICD-10-CM | POA: Diagnosis not present

## 2021-10-10 DIAGNOSIS — E876 Hypokalemia: Secondary | ICD-10-CM | POA: Diagnosis not present

## 2021-10-10 DIAGNOSIS — Z66 Do not resuscitate: Secondary | ICD-10-CM | POA: Diagnosis not present

## 2021-10-10 DIAGNOSIS — R0609 Other forms of dyspnea: Secondary | ICD-10-CM | POA: Diagnosis not present

## 2021-10-10 DIAGNOSIS — A4189 Other specified sepsis: Secondary | ICD-10-CM | POA: Diagnosis present

## 2021-10-10 DIAGNOSIS — K626 Ulcer of anus and rectum: Secondary | ICD-10-CM | POA: Diagnosis present

## 2021-10-10 DIAGNOSIS — K921 Melena: Secondary | ICD-10-CM | POA: Diagnosis not present

## 2021-10-10 DIAGNOSIS — D62 Acute posthemorrhagic anemia: Secondary | ICD-10-CM | POA: Diagnosis not present

## 2021-10-10 DIAGNOSIS — N179 Acute kidney failure, unspecified: Secondary | ICD-10-CM | POA: Diagnosis not present

## 2021-10-10 DIAGNOSIS — J1282 Pneumonia due to coronavirus disease 2019: Secondary | ICD-10-CM | POA: Diagnosis present

## 2021-10-10 DIAGNOSIS — I219 Acute myocardial infarction, unspecified: Secondary | ICD-10-CM | POA: Diagnosis not present

## 2021-10-10 DIAGNOSIS — Z7189 Other specified counseling: Secondary | ICD-10-CM | POA: Diagnosis not present

## 2021-10-10 DIAGNOSIS — K922 Gastrointestinal hemorrhage, unspecified: Secondary | ICD-10-CM | POA: Diagnosis not present

## 2021-10-10 HISTORY — DX: Adult neglect or abandonment, confirmed, initial encounter: T74.01XA

## 2021-10-10 HISTORY — DX: Dehydration: E86.0

## 2021-10-10 HISTORY — DX: Hyperosmolality and hypernatremia: E87.0

## 2021-10-10 LAB — COMPREHENSIVE METABOLIC PANEL
ALT: 139 U/L — ABNORMAL HIGH (ref 0–44)
AST: <5 U/L — ABNORMAL LOW (ref 15–41)
Albumin: 2.5 g/dL — ABNORMAL LOW (ref 3.5–5.0)
Alkaline Phosphatase: 63 U/L (ref 38–126)
Anion gap: 12 (ref 5–15)
BUN: 136 mg/dL — ABNORMAL HIGH (ref 8–23)
CO2: 19 mmol/L — ABNORMAL LOW (ref 22–32)
Calcium: 8.7 mg/dL — ABNORMAL LOW (ref 8.9–10.3)
Chloride: 116 mmol/L — ABNORMAL HIGH (ref 98–111)
Creatinine, Ser: 2.77 mg/dL — ABNORMAL HIGH (ref 0.61–1.24)
GFR, Estimated: 25 mL/min — ABNORMAL LOW (ref >60)
Glucose, Bld: 128 mg/dL — ABNORMAL HIGH (ref 70–99)
Potassium: 3.4 mmol/L — ABNORMAL LOW (ref 3.5–5.1)
Sodium: 147 mmol/L — ABNORMAL HIGH (ref 135–145)
Total Bilirubin: 1.5 mg/dL — ABNORMAL HIGH (ref 0.3–1.2)
Total Protein: 5.6 g/dL — ABNORMAL LOW (ref 6.5–8.1)

## 2021-10-10 LAB — CBC WITH DIFFERENTIAL/PLATELET
Abs Immature Granulocytes: 0.23 10*3/uL — ABNORMAL HIGH (ref 0.00–0.07)
Basophils Absolute: 0.2 10*3/uL — ABNORMAL HIGH (ref 0.0–0.1)
Basophils Relative: 1 %
Eosinophils Absolute: 0 10*3/uL (ref 0.0–0.5)
Eosinophils Relative: 0 %
HCT: 44.8 % (ref 39.0–52.0)
Hemoglobin: 15 g/dL (ref 13.0–17.0)
Immature Granulocytes: 1 %
Lymphocytes Relative: 4 %
Lymphs Abs: 0.8 10*3/uL (ref 0.7–4.0)
MCH: 30.2 pg (ref 26.0–34.0)
MCHC: 33.5 g/dL (ref 30.0–36.0)
MCV: 90.3 fL (ref 80.0–100.0)
Monocytes Absolute: 0.7 10*3/uL (ref 0.1–1.0)
Monocytes Relative: 3 %
Neutro Abs: 19.1 10*3/uL — ABNORMAL HIGH (ref 1.7–7.7)
Neutrophils Relative %: 91 %
Platelets: 391 10*3/uL (ref 150–400)
RBC: 4.96 MIL/uL (ref 4.22–5.81)
RDW: 15 % (ref 11.5–15.5)
WBC: 21 10*3/uL — ABNORMAL HIGH (ref 4.0–10.5)
nRBC: 0.1 % (ref 0.0–0.2)

## 2021-10-10 LAB — HIV ANTIBODY (ROUTINE TESTING W REFLEX): HIV Screen 4th Generation wRfx: NONREACTIVE

## 2021-10-10 LAB — C-REACTIVE PROTEIN: CRP: 21.5 mg/dL — ABNORMAL HIGH (ref <1.0)

## 2021-10-10 LAB — MRSA NEXT GEN BY PCR, NASAL: MRSA by PCR Next Gen: NOT DETECTED

## 2021-10-10 LAB — LACTIC ACID, PLASMA: Lactic Acid, Venous: 2.5 mmol/L (ref 0.5–1.9)

## 2021-10-10 LAB — MAGNESIUM: Magnesium: 2.7 mg/dL — ABNORMAL HIGH (ref 1.7–2.4)

## 2021-10-10 LAB — PROCALCITONIN: Procalcitonin: 2.43 ng/mL

## 2021-10-10 LAB — STREP PNEUMONIAE URINARY ANTIGEN: Strep Pneumo Urinary Antigen: NEGATIVE

## 2021-10-10 MED ORDER — MUPIROCIN 2 % EX OINT
1.0000 "application " | TOPICAL_OINTMENT | Freq: Two times a day (BID) | CUTANEOUS | Status: DC
  Administered 2021-10-10 – 2021-10-12 (×6): 1 via NASAL
  Filled 2021-10-10 (×3): qty 22

## 2021-10-10 MED ORDER — SODIUM CHLORIDE 0.9 % IV SOLN
100.0000 mg | Freq: Once | INTRAVENOUS | Status: AC
  Administered 2021-10-10: 100 mg via INTRAVENOUS
  Filled 2021-10-10: qty 20

## 2021-10-10 MED ORDER — PERMETHRIN 1 % EX LOTN
TOPICAL_LOTION | Freq: Once | CUTANEOUS | Status: DC
  Filled 2021-10-10: qty 59

## 2021-10-10 MED ORDER — ORAL CARE MOUTH RINSE
15.0000 mL | Freq: Two times a day (BID) | OROMUCOSAL | Status: DC
  Administered 2021-10-10: 15 mL via OROMUCOSAL

## 2021-10-10 MED ORDER — ACETAMINOPHEN 650 MG RE SUPP: 650.0000 mg | RECTAL | Status: DC | PRN

## 2021-10-10 MED ORDER — ORAL CARE MOUTH RINSE
15.0000 mL | Freq: Two times a day (BID) | OROMUCOSAL | Status: DC
  Administered 2021-10-10 – 2021-10-11 (×3): 15 mL via OROMUCOSAL

## 2021-10-10 MED ORDER — SODIUM CHLORIDE 0.9 % IV SOLN: 200.0000 mg | Freq: Once | INTRAVENOUS | Status: DC

## 2021-10-10 MED ORDER — POTASSIUM CHLORIDE CRYS ER 20 MEQ PO TBCR
40.0000 meq | EXTENDED_RELEASE_TABLET | Freq: Once | ORAL | Status: DC
  Filled 2021-10-10: qty 2

## 2021-10-10 MED ORDER — CHLORHEXIDINE GLUCONATE 0.12 % MT SOLN
15.0000 mL | Freq: Two times a day (BID) | OROMUCOSAL | Status: DC
  Administered 2021-10-10 – 2021-10-11 (×3): 15 mL via OROMUCOSAL
  Filled 2021-10-10 (×3): qty 15

## 2021-10-10 MED ORDER — CHLORHEXIDINE GLUCONATE CLOTH 2 % EX PADS: 6.0000 | MEDICATED_PAD | Freq: Every day | CUTANEOUS | Status: DC

## 2021-10-10 MED ORDER — SODIUM CHLORIDE 0.9 % IV SOLN: 100.0000 mg | Freq: Every day | INTRAVENOUS | Status: DC

## 2021-10-10 MED ORDER — ONDANSETRON HCL 4 MG/2ML IJ SOLN: 4.0000 mg | Freq: Four times a day (QID) | INTRAMUSCULAR | Status: DC | PRN

## 2021-10-10 MED ORDER — SODIUM CHLORIDE 0.9 % IV SOLN
100.0000 mg | Freq: Every day | INTRAVENOUS | Status: AC
  Administered 2021-10-11 – 2021-10-14 (×4): 100 mg via INTRAVENOUS
  Filled 2021-10-10 (×4): qty 20

## 2021-10-10 MED ORDER — SODIUM CHLORIDE 0.9 % IV SOLN
2.0000 g | INTRAVENOUS | Status: DC
  Administered 2021-10-10: 2 g via INTRAVENOUS
  Filled 2021-10-10: qty 2

## 2021-10-10 MED ORDER — DEXTROSE 5 % IV SOLN: INTRAVENOUS | Status: DC

## 2021-10-10 MED ORDER — HEPARIN SODIUM (PORCINE) 5000 UNIT/ML IJ SOLN
5000.0000 [IU] | Freq: Three times a day (TID) | INTRAMUSCULAR | Status: DC
  Administered 2021-10-10 – 2021-10-14 (×13): 5000 [IU] via SUBCUTANEOUS
  Filled 2021-10-10 (×14): qty 1

## 2021-10-10 MED ORDER — VANCOMYCIN VARIABLE DOSE PER UNSTABLE RENAL FUNCTION (PHARMACIST DOSING): Status: DC

## 2021-10-10 MED ORDER — CHLORHEXIDINE GLUCONATE CLOTH 2 % EX PADS
6.0000 | MEDICATED_PAD | Freq: Every day | CUTANEOUS | Status: DC
  Administered 2021-10-10 – 2021-10-12 (×2): 6 via TOPICAL

## 2021-10-10 MED ORDER — DEXAMETHASONE SODIUM PHOSPHATE 10 MG/ML IJ SOLN
6.0000 mg | INTRAMUSCULAR | Status: DC
  Administered 2021-10-10 – 2021-10-17 (×8): 6 mg via INTRAVENOUS
  Filled 2021-10-10 (×8): qty 1

## 2021-10-10 NOTE — Progress Notes (Signed)
Attempted flutter with PT. Pt no cooperating and unable to use.

## 2021-10-10 NOTE — Assessment & Plan Note (Signed)
Continue with IV ABX with cefepime and vanco
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
## 2021-10-10 NOTE — Assessment & Plan Note (Signed)
Continue supplemental O2. Likely a combination of covid-19 and bilateral pneumonia.
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
## 2021-10-10 NOTE — Assessment & Plan Note (Signed)
Continue with D5W. Free water deficit of about 3-4 liters.

## 2021-10-10 NOTE — Progress Notes (Signed)
° ° °  OVERNIGHT PROGRESS REPORT  Notified by RN for discovery of possible Lice. Appropriate isolation and cleansing in progress.  Chinita Greenland MSNA MSN ACNPC-AG Acute Care Nurse Practitioner [facility]

## 2021-10-10 NOTE — Assessment & Plan Note (Signed)
Unclear how long he has had covid. Start remdesivir and decadron.
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
## 2021-10-10 NOTE — Assessment & Plan Note (Signed)
Continue with IVf. Has foley catheter.

## 2021-10-10 NOTE — Subjective & Objective (Signed)
CC:AMS HPI: 61 year old white male with no medical history in our EMR presents ER with reported altered mental status and weakness for 2 to 3 days.  Per EDP report, EMS stated that she will come evaluate.  Patient was found in recliner that was soiled with urine and stool.  Patient noted to be tachycardic and hypoxic to 70% on room air.  He was given IV fluids in the ambulance.  Patient cannot give any history or review of systems.  Arrival to the ER, temp 97.3 heart rate 125 blood pressure 145/105 satting 88% on room air.  Labs: Urine drug screen was negative. White count 25.5, hemoglobin 18.6, platelets 623  UA was essentially negative.  D-dimer elevated at 4.97  Initial lactic acid of 6.4, after IV fluids decreased to 3.5  Chemistry sodium 151, potassium 4.5, chloride of 14, BUN of 126, creatinine 3.5  AST of 213, ALT of 89, total bili 1.7  COVID test was positive BNP 165  CT head was negative for acute intracranial abnormality  CT chest abdomen pelvis without contrast showed bilateral lower lobe pneumonia  4.4 cm thoracic aortic aneurysm  No family present at bedside.  [facility] contacted for admission.

## 2021-10-10 NOTE — Progress Notes (Signed)
Pharmacy Antibiotic Note  Brandon Bond is a 61 y.o. male admitted on 10/20/2021 with reported altered mental status and weakness for 2 to 3 days. CT chest abdomen pelvis without contrast showed bilateral lower lobe pneumonia.  Pharmacy has been consulted for vancomycin and cefepime dosing.  Plan: Pt received  vancomycin 1gm x 1 in ED, consider random vanc level in 24-48 hours before re-dosing (Scr 3.54) Cefepime 2gm IV q24h Follow renal function, cultures and clinical course  Height: 5\' 11"  (180.3 cm) Weight: 63.5 kg (140 lb) IBW/kg (Calculated) : 75.3  Temp (24hrs), Avg:97.8 F (36.6 C), Min:97.3 F (36.3 C), Max:98 F (36.7 C)  Recent Labs  Lab 2021-10-20 1826 10/20/21 1827 October 20, 2021 2011 Oct 20, 2021 2230 Oct 20, 2021 2330  WBC  --  25.5*  --   --   --   CREATININE  --  3.54*  --   --   --   LATICACIDVEN 7.7*  --  6.4* 3.5* 2.5*    Estimated Creatinine Clearance: 19.7 mL/min (A) (by C-G formula based on SCr of 3.54 mg/dL (H)).    No Known Allergies  Antimicrobials this admission: 2/20 vanc >> 2/20 cefepime >> 2/20 flagyl x 1 2/21 remdesivir x 5 days  Dose adjustments this admission:   Microbiology results: 2/20 BCx:  2/20 UCx:     Thank you for allowing pharmacy to be a part of this patients care.  3/20 RPh 10/10/2021, 12:43 AM

## 2021-10-10 NOTE — Assessment & Plan Note (Addendum)
Admit to SDU. Was given 30 ml/kg IVF with LR.  With his NS, will start D5W and monitor his urine output.  EDP reports he talk to the patient's significant other who wanted the patient remains a full code.  There is no family member present at bedside.

## 2021-10-10 NOTE — Assessment & Plan Note (Signed)
Continue with D5W

## 2021-10-11 DIAGNOSIS — R7401 Elevation of levels of liver transaminase levels: Secondary | ICD-10-CM

## 2021-10-11 DIAGNOSIS — U071 COVID-19: Secondary | ICD-10-CM | POA: Diagnosis not present

## 2021-10-11 DIAGNOSIS — E876 Hypokalemia: Secondary | ICD-10-CM

## 2021-10-11 DIAGNOSIS — L8990 Pressure ulcer of unspecified site, unspecified stage: Secondary | ICD-10-CM | POA: Diagnosis present

## 2021-10-11 DIAGNOSIS — E870 Hyperosmolality and hypernatremia: Secondary | ICD-10-CM | POA: Diagnosis not present

## 2021-10-11 DIAGNOSIS — A419 Sepsis, unspecified organism: Secondary | ICD-10-CM | POA: Diagnosis not present

## 2021-10-11 DIAGNOSIS — N179 Acute kidney failure, unspecified: Secondary | ICD-10-CM | POA: Diagnosis not present

## 2021-10-11 HISTORY — DX: Hypokalemia: E87.6

## 2021-10-11 LAB — COMPREHENSIVE METABOLIC PANEL
ALT: 97 U/L — ABNORMAL HIGH (ref 0–44)
AST: 70 U/L — ABNORMAL HIGH (ref 15–41)
Albumin: 2.2 g/dL — ABNORMAL LOW (ref 3.5–5.0)
Alkaline Phosphatase: 58 U/L (ref 38–126)
Anion gap: 10 (ref 5–15)
BUN: 97 mg/dL — ABNORMAL HIGH (ref 8–23)
CO2: 20 mmol/L — ABNORMAL LOW (ref 22–32)
Calcium: 8.5 mg/dL — ABNORMAL LOW (ref 8.9–10.3)
Chloride: 113 mmol/L — ABNORMAL HIGH (ref 98–111)
Creatinine, Ser: 1.55 mg/dL — ABNORMAL HIGH (ref 0.61–1.24)
GFR, Estimated: 51 mL/min — ABNORMAL LOW (ref >60)
Glucose, Bld: 148 mg/dL — ABNORMAL HIGH (ref 70–99)
Potassium: 3 mmol/L — ABNORMAL LOW (ref 3.5–5.1)
Sodium: 143 mmol/L (ref 135–145)
Total Bilirubin: 1 mg/dL (ref 0.3–1.2)
Total Protein: 5.6 g/dL — ABNORMAL LOW (ref 6.5–8.1)

## 2021-10-11 LAB — CBC
HCT: 45.4 % (ref 39.0–52.0)
Hemoglobin: 15.4 g/dL (ref 13.0–17.0)
MCH: 29.8 pg (ref 26.0–34.0)
MCHC: 33.9 g/dL (ref 30.0–36.0)
MCV: 88 fL (ref 80.0–100.0)
Platelets: 289 10*3/uL (ref 150–400)
RBC: 5.16 MIL/uL (ref 4.22–5.81)
RDW: 14.8 % (ref 11.5–15.5)
WBC: 19 10*3/uL — ABNORMAL HIGH (ref 4.0–10.5)
nRBC: 0.1 % (ref 0.0–0.2)

## 2021-10-11 LAB — LEGIONELLA PNEUMOPHILA SEROGP 1 UR AG: L. pneumophila Serogp 1 Ur Ag: NEGATIVE

## 2021-10-11 LAB — URINE CULTURE: Culture: NO GROWTH

## 2021-10-11 LAB — CK: Total CK: 103 U/L (ref 49–397)

## 2021-10-11 MED ORDER — POTASSIUM CHLORIDE CRYS ER 20 MEQ PO TBCR: 40.0000 meq | EXTENDED_RELEASE_TABLET | Freq: Two times a day (BID) | ORAL | Status: DC

## 2021-10-11 MED ORDER — SODIUM CHLORIDE 0.9 % IV SOLN
2.0000 g | Freq: Two times a day (BID) | INTRAVENOUS | Status: DC
  Administered 2021-10-11 – 2021-10-14 (×6): 2 g via INTRAVENOUS
  Filled 2021-10-11 (×6): qty 2

## 2021-10-11 MED ORDER — SODIUM CHLORIDE 0.9 % IV BOLUS: 2000.0000 mL | Freq: Once | INTRAVENOUS | Status: DC

## 2021-10-11 MED ORDER — POTASSIUM CHLORIDE 10 MEQ/100ML IV SOLN
10.0000 meq | INTRAVENOUS | Status: AC
  Administered 2021-10-11 (×5): 10 meq via INTRAVENOUS
  Filled 2021-10-11 (×2): qty 100

## 2021-10-11 MED ORDER — SODIUM CHLORIDE 0.9 % IV BOLUS
1000.0000 mL | Freq: Once | INTRAVENOUS | Status: AC
  Administered 2021-10-11: 1000 mL via INTRAVENOUS

## 2021-10-11 MED ORDER — COLLAGENASE 250 UNIT/GM EX OINT
TOPICAL_OINTMENT | Freq: Every day | CUTANEOUS | Status: DC
  Administered 2021-10-22: 1 via TOPICAL
  Filled 2021-10-11: qty 30

## 2021-10-11 MED ORDER — POTASSIUM CHLORIDE 20 MEQ PO PACK: 40.0000 meq | PACK | Freq: Two times a day (BID) | ORAL | Status: DC

## 2021-10-11 NOTE — Progress Notes (Signed)
MD notified of pt's K level of 3, and periods of Bradycardia, BP wnl. Awaiting replacement orders. Monitoring pt closely.

## 2021-10-11 NOTE — Progress Notes (Addendum)
[facility] PROGRESS NOTE    Progress Note  Brandon Bond  [id number] DOB: 06/11/1961 DOA: 10/07/2021 PCP: Lawerance Cruel, MD     Brief Narrative:   Brandon Bond is an 61 y.o. male no significant past medical history brought into the ED for altered mental status and weakness that started 3 days prior to admission, EMS found the patient soiled in stool in his recliner, was found to be tachycardic and hypoxic satting 70% on room air was started on IV fluids in the ED UDS was negative white count of 25,000, D-dimer of 5 lactic acid of 6 SARS-CoV-2 PCR was positive CT of the head was negative for any acute abnormalities. CT scan of the abdomen and pelvis showed bilateral lower lobe pneumonia and a 4.4 cm aortic aneurysm was started on IV fluids and sepsis protocol along with antibiotics.    Assessment/Plan:   Sepsis with acute organ dysfunction without septic shock ([place]) secondarily to COVID-19 pneumonia: Sepsis physiology has resolved with IV fluid resuscitation started empirically on Vanco and cefepime, MRSA PCR was negative Vanco was discontinued. He was continued on IV remdesivir and steroids as remain afebrile his leukocytosis is improving. Procalcitonin is 2.4.  Continue IV cefepime. Stop checking inflammatory markers.  Acute metabolic encephalopathy: Likely multifactorial in the setting of infectious etiology and hyponatremia. Has remained afebrile leukocytosis improving, his sodium is improving slowly his mentation is improved this morning. CT of the head showed no acute findings. Continue IV antibiotics. Keep him n.p.o.  Hypovolemic hypernatremia: Started on D5W improving nicely basic metabolic panels pending this morning.  Acute renal failure (ARF) (HCC) Unknown baseline, on admission 3.5, has been improving as creatinine 2.7 basic metabolic panels pending this morning we will continue to trend. Bolus, liter of normal saline. Follow strict I's and O's and daily  weights. Check a CK  Hypokalemia: Likely due to decrease oral intake, replete IV recheck in am.  Adult neglect We will TOC has been consulted. He relates he lives by himself.  Severe protein caloric malnutrition: Noted.  Elevated LFTs of unclear etiology: He denies any abdominal pain anorexic alkaline phosphatase is normal. Check a CK. Denies abdominal pain.  Scrotal medial left and right ulcer present on admission RN Pressure Injury Documentation: Pressure Injury 10/10/21 Scrotum Medial;Left;Right Deep Tissue Pressure Injury - Purple or maroon localized area of discolored intact skin or blood-filled blister due to damage of underlying soft tissue from pressure and/or shear. black cover (Active)  10/10/21 1000  Location: Scrotum  Location Orientation: Medial;Left;Right  Staging: Deep Tissue Pressure Injury - Purple or maroon localized area of discolored intact skin or blood-filled blister due to damage of underlying soft tissue from pressure and/or shear.  Wound Description (Comments): black cover  Present on Admission: Yes     Pressure Injury 10/10/21 Sacrum Medial Unstageable - Full thickness tissue loss in which the base of the injury is covered by slough (yellow, tan, gray, green or brown) and/or eschar (tan, brown or black) in the wound bed. (Active)  10/10/21 1000  Location: Sacrum  Location Orientation: Medial  Staging: Unstageable - Full thickness tissue loss in which the base of the injury is covered by slough (yellow, tan, gray, green or brown) and/or eschar (tan, brown or black) in the wound bed.  Wound Description (Comments):   Present on Admission: Yes    Estimated body mass index is 16.85 kg/m as calculated from the following:   Height as of this encounter: 5\' 11"  (1.803 m).   Weight  as of this encounter: 54.8 kg.    DVT prophylaxis: lovenox Family Communication:none Status is: Inpatient Remains inpatient appropriate because: Sepsis  physiology            Code Status:     Code Status Orders  (From admission, onward)           Start     Ordered   10/10/21 0033  Full code  Continuous        10/10/21 0032           Code Status History     This patient has a current code status but no historical code status.         IV Access:   Peripheral IV   Procedures and diagnostic studies:   CT Head Wo Contrast  Result Date: 10/05/2021 CLINICAL DATA:  Altered mental status EXAM: CT HEAD WITHOUT CONTRAST TECHNIQUE: Contiguous axial images were obtained from the base of the skull through the vertex without intravenous contrast. RADIATION DOSE REDUCTION: This exam was performed according to the departmental dose-optimization program which includes automated exposure control, adjustment of the mA and/or kV according to patient size and/or use of iterative reconstruction technique. COMPARISON:  None. FINDINGS: Brain: No evidence of acute infarction, hemorrhage, hydrocephalus, extra-axial collection or mass lesion/mass effect. Vascular: No hyperdense vessel or unexpected calcification. Skull: Normal. Negative for fracture or focal lesion. Sinuses/Orbits: No acute finding. Other: None. IMPRESSION: No acute intracranial abnormality noted. Electronically Signed   By: Inez Catalina M.D.   On: 09/25/2021 19:16   DG Chest Port 1 View  Result Date: 10/13/2021 CLINICAL DATA:  Possible sepsis EXAM: PORTABLE CHEST 1 VIEW COMPARISON:  03/22/2009 FINDINGS: Cardiac shadow is within normal limits. The lungs are well aerated bilaterally. Bibasilar medial airspace opacities are noted distant with multifocal pneumonia. No sizable effusion is seen. No bony abnormality is noted. IMPRESSION: Bibasilar infiltrates Electronically Signed   By: Inez Catalina M.D.   On: 09/29/2021 19:27   CT CHEST ABDOMEN PELVIS WO CONTRAST  Result Date: 09/29/2021 CLINICAL DATA:  Possible sepsis, bibasilar infiltrates on recent chest x-ray EXAM: CT  CHEST, ABDOMEN AND PELVIS WITHOUT CONTRAST TECHNIQUE: Multidetector CT imaging of the chest, abdomen and pelvis was performed following the standard protocol without IV contrast. RADIATION DOSE REDUCTION: This exam was performed according to the departmental dose-optimization program which includes automated exposure control, adjustment of the mA and/or kV according to patient size and/or use of iterative reconstruction technique. COMPARISON:  Chest x-ray from earlier in the same day. FINDINGS: CT CHEST FINDINGS Cardiovascular: Limited due to lack of IV contrast. Atherosclerotic calcifications of the thoracic aorta are noted. Ascending aorta is mildly prominent at 4.4 cm. Coronary calcifications are noted. No cardiac enlargement is noted. Mediastinum/Nodes: Thoracic inlet is within normal limits. No sizable hilar or mediastinal adenopathy is noted. The esophagus is within normal limits. Lungs/Pleura: Lungs are well aerated bilaterally with the exception of bilateral lower lobe infiltrates similar to that seen on prior chest x-ray. No effusions are noted. No parenchymal nodules are seen. Musculoskeletal: Degenerative changes of the thoracic spine are noted. CT ABDOMEN PELVIS FINDINGS Hepatobiliary: No focal liver abnormality is seen. No gallstones, gallbladder wall thickening, or biliary dilatation. Pancreas: Unremarkable. No pancreatic ductal dilatation or surrounding inflammatory changes. Spleen: Normal in size without focal abnormality. Adrenals/Urinary Tract: Adrenal glands are within normal limits. Kidneys demonstrate no renal calculi or obstructive changes. Bladder is decompressed by Foley catheter. Stomach/Bowel: The appendix is within normal limits. No obstructive or inflammatory changes  of the colon are noted. Fecal material is noted throughout the colon particularly in the rectal vault which may represent some early constipation. Small bowel and stomach are within normal limits. Vascular/Lymphatic: Aortic  atherosclerosis. No enlarged abdominal or pelvic lymph nodes. Reproductive: Prostate is unremarkable. Other: No abdominal wall hernia or abnormality. No abdominopelvic ascites. Musculoskeletal: Degenerative changes of lumbar spine are noted. IMPRESSION: CT of the chest: Bilateral lower lobe infiltrates similar to that seen on recent chest x-ray. These changes may be related to aspiration. Prominent thoracic aortic aneurysm at 4.4 cm. Recommend annual imaging followup by CTA or MRA. This recommendation follows 2010 ACCF/AHA/AATS/ACR/ASA/SCA/SCAI/SIR/STS/SVM Guidelines for the Diagnosis and Management of Patients with Thoracic Aortic Disease. Circulation. 2010; [id number]. Aortic aneurysm NOS (ICD10-I71.9) CT of the abdomen and pelvis: Mild fecal retention particularly in the rectal vault. No other focal abnormality is noted. Electronically Signed   By: Inez Catalina M.D.   On: 09/24/2021 22:37     Medical Consultants:   None.   Subjective:    Jadenn Brookbank relates he feels better today denies any abdominal pain not hungry  Objective:    Vitals:   10/11/21 0500 10/11/21 0600 10/11/21 0700 10/11/21 0800  BP: (!) 115/94 131/84 (!) 102/42   Pulse:  66 (!) 52 (!) 51  Resp: 20 (!) 21 18 18   Temp: (!) 96.4 F (35.8 C) (!) 96.6 F (35.9 C) (!) 97 F (36.1 C) (!) 97.2 F (36.2 C)  TempSrc:      SpO2:  95% 95% 93%  Weight: 54.8 kg     Height:       SpO2: 93 % O2 Flow Rate (L/min): 4 L/min   Intake/Output Summary (Last 24 hours) at 10/11/2021 0900 Last data filed at 10/11/2021 0530 Gross per 24 hour  Intake 1732.62 ml  Output 1495 ml  Net 237.62 ml   Filed Weights   10/08/2021 1836 10/11/21 0500  Weight: 63.5 kg 54.8 kg    Exam: General exam: In no acute distress, cachectic Respiratory system: Good air movement and diffuse crackles bilaterally Cardiovascular system: S1 & S2 heard, RRR. No JVD. Gastrointestinal system: Abdomen is nondistended, soft and nontender.  Extremities: No  pedal edema. Skin: No rashes, lesions or ulcers Psychiatry: He is alert and oriented x3 he does not remember why he is in the hospital   Data Reviewed:    Labs: Basic Metabolic Panel: Recent Labs  Lab 09/30/2021 1827 10/10/21 0410  NA 151* 147*  K 4.5 3.4*  CL 114* 116*  CO2 14* 19*  GLUCOSE 142* 128*  BUN 126* 136*  CREATININE 3.54* 2.77*  CALCIUM 9.7 8.7*  MG 3.7* 2.7*   GFR Estimated Creatinine Clearance: 21.7 mL/min (A) (by C-G formula based on SCr of 2.77 mg/dL (H)). Liver Function Tests: Recent Labs  Lab 09/23/2021 1827 10/10/21 0410  AST 213* <5*  ALT 89* 139*  ALKPHOS 86 63  BILITOT 1.7* 1.5*  PROT 7.8 5.6*  ALBUMIN 3.3* 2.5*   No results for input(s): LIPASE, AMYLASE in the last 168 hours. No results for input(s): AMMONIA in the last 168 hours. Coagulation profile Recent Labs  Lab 10/01/2021 1827  INR 1.3*   COVID-19 Labs  Recent Labs    10/16/2021 1827 10/10/21 1900  DDIMER 4.97*  --   CRP  --  21.5*    Lab Results  Component Value Date   SARSCOV2NAA POSITIVE (A) 09/26/2021    CBC: Recent Labs  Lab 09/24/2021 1827 10/10/21 0410 10/11/21 0250  WBC 25.5* 21.0* 19.0*  NEUTROABS 22.7* 19.1*  --   HGB 18.6* 15.0 15.4  HCT 56.1* 44.8 45.4  MCV 90.2 90.3 88.0  PLT 623* 391 289   Cardiac Enzymes: No results for input(s): CKTOTAL, CKMB, CKMBINDEX, TROPONINI in the last 168 hours. BNP (last 3 results) No results for input(s): PROBNP in the last 8760 hours. CBG: No results for input(s): GLUCAP in the last 168 hours. D-Dimer: Recent Labs    09/21/2021 1827  DDIMER 4.97*   Hgb A1c: No results for input(s): HGBA1C in the last 72 hours. Lipid Profile: No results for input(s): CHOL, HDL, LDLCALC, TRIG, CHOLHDL, LDLDIRECT in the last 72 hours. Thyroid function studies: No results for input(s): TSH, T4TOTAL, T3FREE, THYROIDAB in the last 72 hours.  Invalid input(s): FREET3 Anemia work up: No results for input(s): VITAMINB12, FOLATE, FERRITIN,  TIBC, IRON, RETICCTPCT in the last 72 hours. Sepsis Labs: Recent Labs  Lab 09/26/2021 1826 10/07/2021 1827 10/15/2021 2011 10/15/2021 2230 10/15/2021 2330 10/10/21 0410 10/10/21 1900 10/11/21 0250  PROCALCITON  --   --   --   --   --   --  2.43  --   WBC  --  25.5*  --   --   --  21.0*  --  19.0*  LATICACIDVEN 7.7*  --  6.4* 3.5* 2.5*  --   --   --    Microbiology Recent Results (from the past 240 hour(s))  Resp Panel by RT-PCR (Flu A&B, Covid) Nasopharyngeal Swab     Status: Abnormal   Collection Time: 10/16/2021  6:26 PM   Specimen: Nasopharyngeal Swab; Nasopharyngeal(NP) swabs in vial transport medium  Result Value Ref Range Status   SARS Coronavirus 2 by RT PCR POSITIVE (A) NEGATIVE Final    Comment: (NOTE) SARS-CoV-2 target nucleic acids are DETECTED.  The SARS-CoV-2 RNA is generally detectable in upper respiratory specimens during the acute phase of infection. Positive results are indicative of the presence of the identified virus, but do not rule out bacterial infection or co-infection with other pathogens not detected by the test. Clinical correlation with patient history and other diagnostic information is necessary to determine patient infection status. The expected result is Negative.  Fact Sheet for Patients: EntrepreneurPulse.com.au  Fact Sheet for Healthcare Providers: IncredibleEmployment.be  This test is not yet approved or cleared by the [place] FDA and  has been authorized for detection and/or diagnosis of SARS-CoV-2 by FDA under an Emergency Use Authorization (EUA).  This EUA will remain in effect (meaning this test can be used) for the duration of  the COVID-19 declaration under Section 564(b)(1) of the A ct, 21 U.S.C. section 360bbb-3(b)(1), unless the authorization is terminated or revoked sooner.     Influenza A by PCR NEGATIVE NEGATIVE Final   Influenza B by PCR NEGATIVE NEGATIVE Final    Comment: (NOTE) The  Xpert Xpress SARS-CoV-2/FLU/RSV plus assay is intended as an aid in the diagnosis of influenza from Nasopharyngeal swab specimens and should not be used as a sole basis for treatment. Nasal washings and aspirates are unacceptable for Xpert Xpress SARS-CoV-2/FLU/RSV testing.  Fact Sheet for Patients: EntrepreneurPulse.com.au  Fact Sheet for Healthcare Providers: IncredibleEmployment.be  This test is not yet approved or cleared by the [place] FDA and has been authorized for detection and/or diagnosis of SARS-CoV-2 by FDA under an Emergency Use Authorization (EUA). This EUA will remain in effect (meaning this test can be used) for the duration of the COVID-19 declaration under Section 564(b)(1) of  the Act, 21 U.S.C. section 360bbb-3(b)(1), unless the authorization is terminated or revoked.  Performed at [facility], [place] [address] [place], [place] [address]   Blood Culture (routine x 2)     Status: None (Preliminary result)   Collection Time: 10/06/2021  6:26 PM   Specimen: BLOOD  Result Value Ref Range Status   Specimen Description   Final    BLOOD BLOOD LEFT HAND Performed at [place] [address], [place], [place] [address]    Special Requests   Final    BOTTLES DRAWN AEROBIC AND ANAEROBIC Blood Culture results may not be optimal due to an inadequate volume of blood received in culture bottles Performed at [place] [address], [place], [place] [address]    Culture   Final    NO GROWTH 1 DAY Performed at [facility], [place] [address] [place], [place] [address]    Report Status PENDING  Incomplete  Blood Culture (routine x 2)     Status: None (Preliminary result)   Collection Time: 10/01/2021  6:26 PM   Specimen: BLOOD  Result Value Ref Range Status   Specimen Description   Final    BLOOD RIGHT ANTECUBITAL Performed at [place] [address] [place], [place] [address]    Special Requests   Final    BOTTLES DRAWN AEROBIC AND ANAEROBIC Blood Culture adequate volume Performed at [facility] [address], [place], [place] [address]    Culture   Final    NO GROWTH 1 DAY Performed at [facility], [place] [address], [place], [place] [address]    Report Status PENDING  Incomplete  Urine Culture     Status: None   Collection Time: 10/04/2021  6:26 PM   Specimen: In/Out Cath Urine  Result Value Ref Range Status   Specimen Description   Final    IN/OUT CATH URINE Performed at [place] [address] [place], [place] [address]    Special Requests   Final    NONE Performed at [facility], [place] [address], [place], [place] [address]    Culture   Final    NO GROWTH Performed at [facility], [place] [address] [place], [place] [address]    Report Status 10/11/2021 FINAL  Final  MRSA Next Gen by PCR, Nasal     Status: None   Collection Time: 10/10/21  9:52 AM   Specimen: Nasal Mucosa; Nasal Swab  Result Value Ref Range Status   MRSA by PCR Next Gen NOT DETECTED NOT DETECTED Final    Comment: (NOTE) The GeneXpert MRSA Assay (FDA approved for NASAL specimens only), is one component of a comprehensive MRSA colonization surveillance program. It is not intended to diagnose MRSA infection nor to guide or monitor treatment for MRSA infections. Test performance is not FDA approved in patients less than 59 years old. Performed at [facility], [place] [address], [place], [place] [address]      Medications:    chlorhexidine  15 mL Mouth Rinse BID   Chlorhexidine Gluconate Cloth  6 each Topical Daily   dexamethasone (DECADRON) injection  6 mg Intravenous Q24H   heparin  5,000 Units Subcutaneous Q8H   mouth rinse  15 mL Mouth Rinse q12n4p   mupirocin ointment  1 application Nasal BID   permethrin   Topical Once   potassium  chloride  40 mEq Oral Once   Continuous Infusions:  ceFEPime (MAXIPIME) IV Stopped (10/10/21 1746)   dextrose 100 mL/hr at 10/11/21 0746   remdesivir 100 mg in NS 100 mL        LOS: 1 day   Charlynne Cousins  [facility]  10/11/2021, 9:00 AM

## 2021-10-11 NOTE — Progress Notes (Signed)
PHARMACY NOTE:  ANTIMICROBIAL RENAL DOSAGE ADJUSTMENT  Current antimicrobial regimen includes a mismatch between antimicrobial dosage and estimated renal function. As per policy approved by the Pharmacy & Therapeutics and Medical Executive Committees, the antimicrobial dosage will be adjusted accordingly.  Current antimicrobial and dosage:  Cefepime 2g q24 hr  Indication: PNA  Renal Function:   Estimated Creatinine Clearance: 38.8 mL/min (A) (by C-G formula based on SCr of 1.55 mg/dL (H)). []      On intermittent HD, scheduled: []      On CRRT    Antimicrobial dosage has been changed to:  2g IV q12 hr   Additional Comments: improving CrCl   Thank you for allowing pharmacy to be a part of this patient's care.  , PharmD, BCPS [phone] 10/11/2021, 2:56 PM

## 2021-10-11 NOTE — Consult Note (Signed)
[facility] Nurse Consult Note: Reason for Consult: multiple wounds; Admitted from home AMS, soiled in stool and urine, history of depression, patient dishevel appearance, areas of concern on the bilateral elbows, flank, and sacrum. Emaciated. Pattern of chair on the patient hips and thighs.   Wound type: Unstageable pressure injury; sacrum; 3cm x 3.5cm x 0cm; 100% eschar  Unstageable pressure injury; left ischium; 1cm x 1cm x0.1cm: 100% eschar Stage 2 pressure injury; 1cm x 1cm x 0.2cm; 100%  pink Unstageable pressure injury; lumbar wound; 1cm x 1.5cm x 0.1cm; 100% yellow  Stage 2 pressure injury; lumbar wound; 1cm x 1cm x 0.1cm; 100% pink  Partial thickness abrasion; right elbow; 100% pink Stage 2 pressure injury: left elbow; 2cm x 3.5cm x 0.2cm: 100% pink  MASD : severe moisture associated skin damage on the lateral rib cage on each side; right side>left side and weeping each area aprox. 10cmx 10cm x 0.1cm; red/weeping  Partial thickness scabbing in the pattern of the recliner on the bilateral trochanter region  Partial thickness abrasion; left elbow Pressure vs moisture; posterior scrotum; 2cm x 2cm  Pressure Injury POA: Yes Measurement:see above  Wound bed: see above  Drainage (amount, consistency, odor) scant  Periwound: intact  Dressing procedure/placement/frequency: Add enzymatic debridement ointment to the sacrum, lumbar and left ischial wounds Foam to the other areas Moisture barrier ointment to the scrotum Low air loss in place for moisture management and pressure redistribution.    Re consult if needed, will not follow at this time. Thanks  Octavius Shin [employer], RN,CWOCN, CNS, [place] [phone])

## 2021-10-12 ENCOUNTER — Inpatient Hospital Stay (HOSPITAL_COMMUNITY): Payer: 59

## 2021-10-12 ENCOUNTER — Encounter (HOSPITAL_COMMUNITY): Payer: Self-pay | Admitting: Internal Medicine

## 2021-10-12 ENCOUNTER — Inpatient Hospital Stay: Payer: Self-pay

## 2021-10-12 DIAGNOSIS — E43 Unspecified severe protein-calorie malnutrition: Secondary | ICD-10-CM | POA: Diagnosis present

## 2021-10-12 DIAGNOSIS — N179 Acute kidney failure, unspecified: Secondary | ICD-10-CM | POA: Diagnosis not present

## 2021-10-12 DIAGNOSIS — E876 Hypokalemia: Secondary | ICD-10-CM

## 2021-10-12 DIAGNOSIS — A419 Sepsis, unspecified organism: Secondary | ICD-10-CM | POA: Diagnosis not present

## 2021-10-12 DIAGNOSIS — J9601 Acute respiratory failure with hypoxia: Secondary | ICD-10-CM | POA: Diagnosis not present

## 2021-10-12 DIAGNOSIS — E872 Acidosis, unspecified: Secondary | ICD-10-CM

## 2021-10-12 DIAGNOSIS — U071 COVID-19: Secondary | ICD-10-CM | POA: Diagnosis not present

## 2021-10-12 LAB — MAGNESIUM
Magnesium: 2.9 mg/dL — ABNORMAL HIGH (ref 1.7–2.4)
Magnesium: 2.5 mg/dL — ABNORMAL HIGH (ref 1.7–2.4)

## 2021-10-12 LAB — BLOOD GAS, ARTERIAL
Acid-base deficit: 7.5 mmol/L — ABNORMAL HIGH (ref 0.0–2.0)
Acid-base deficit: 5.6 mmol/L — ABNORMAL HIGH (ref 0.0–2.0)
Acid-base deficit: 5.7 mmol/L — ABNORMAL HIGH (ref 0.0–2.0)
Bicarbonate: 24.6 mmol/L (ref 20.0–28.0)
Bicarbonate: 24.1 mmol/L (ref 20.0–28.0)
Bicarbonate: 20.7 mmol/L (ref 20.0–28.0)
Drawn by: 23281
Drawn by: 25288
FIO2: 100 %
FIO2: 100 %
MECHVT: 600 mL
O2 Saturation: 91.2 %
O2 Saturation: 100 %
O2 Saturation: 99.9 %
PEEP: 10 cmH2O
Patient temperature: 37.6
Patient temperature: 36.9
Patient temperature: 36.2
RATE: 18 resp/min
pCO2 arterial: 85 mmHg (ref 32–48)
pCO2 arterial: 66 mmHg (ref 32–48)
pCO2 arterial: 41 mmHg (ref 32–48)
pH, Arterial: 7.07 — CL (ref 7.35–7.45)
pH, Arterial: 7.17 — CL (ref 7.35–7.45)
pH, Arterial: 7.3 — ABNORMAL LOW (ref 7.35–7.45)
pO2, Arterial: 88 mmHg (ref 83–108)
pO2, Arterial: 158 mmHg — ABNORMAL HIGH (ref 83–108)
pO2, Arterial: 118 mmHg — ABNORMAL HIGH (ref 83–108)

## 2021-10-12 LAB — CBC
HCT: 47.4 % (ref 39.0–52.0)
Hemoglobin: 15.4 g/dL (ref 13.0–17.0)
MCH: 30.3 pg (ref 26.0–34.0)
MCHC: 32.5 g/dL (ref 30.0–36.0)
MCV: 93.1 fL (ref 80.0–100.0)
Platelets: 417 10*3/uL — ABNORMAL HIGH (ref 150–400)
RBC: 5.09 MIL/uL (ref 4.22–5.81)
RDW: 15.5 % (ref 11.5–15.5)
WBC: 30 10*3/uL — ABNORMAL HIGH (ref 4.0–10.5)
nRBC: 0.2 % (ref 0.0–0.2)

## 2021-10-12 LAB — BASIC METABOLIC PANEL
Anion gap: 8 (ref 5–15)
BUN: 77 mg/dL — ABNORMAL HIGH (ref 8–23)
CO2: 22 mmol/L (ref 22–32)
Calcium: 8.8 mg/dL — ABNORMAL LOW (ref 8.9–10.3)
Chloride: 115 mmol/L — ABNORMAL HIGH (ref 98–111)
Creatinine, Ser: 1.49 mg/dL — ABNORMAL HIGH (ref 0.61–1.24)
GFR, Estimated: 53 mL/min — ABNORMAL LOW (ref >60)
Glucose, Bld: 164 mg/dL — ABNORMAL HIGH (ref 70–99)
Potassium: 4.7 mmol/L (ref 3.5–5.1)
Sodium: 145 mmol/L (ref 135–145)

## 2021-10-12 LAB — GLUCOSE, CAPILLARY
Glucose-Capillary: 132 mg/dL — ABNORMAL HIGH (ref 70–99)
Glucose-Capillary: 126 mg/dL — ABNORMAL HIGH (ref 70–99)

## 2021-10-12 LAB — PHOSPHORUS
Phosphorus: 6.2 mg/dL — ABNORMAL HIGH (ref 2.5–4.6)
Phosphorus: 4.4 mg/dL (ref 2.5–4.6)

## 2021-10-12 MED ORDER — PANTOPRAZOLE 2 MG/ML SUSPENSION
40.0000 mg | Freq: Every day | ORAL | Status: DC
  Administered 2021-10-13: 40 mg
  Filled 2021-10-12 (×2): qty 20

## 2021-10-12 MED ORDER — ROCURONIUM BROMIDE 10 MG/ML (PF) SYRINGE
PREFILLED_SYRINGE | INTRAVENOUS | Status: AC
  Administered 2021-10-12: 50 mg
  Filled 2021-10-12: qty 10

## 2021-10-12 MED ORDER — PHENYLEPHRINE 40 MCG/ML (10ML) SYRINGE FOR IV PUSH (FOR BLOOD PRESSURE SUPPORT): 80.0000 ug | PREFILLED_SYRINGE | Freq: Once | INTRAVENOUS | Status: AC | PRN

## 2021-10-12 MED ORDER — FENTANYL 2500MCG IN NS 250ML (10MCG/ML) PREMIX INFUSION
50.0000 ug/h | INTRAVENOUS | Status: DC
  Administered 2021-10-12: 50 ug/h via INTRAVENOUS
  Administered 2021-10-13: 175 ug/h via INTRAVENOUS
  Administered 2021-10-13: 125 ug/h via INTRAVENOUS
  Administered 2021-10-14: 150 ug/h via INTRAVENOUS
  Filled 2021-10-12 (×4): qty 250

## 2021-10-12 MED ORDER — PROSOURCE TF PO LIQD
45.0000 mL | Freq: Two times a day (BID) | ORAL | Status: DC
  Filled 2021-10-12: qty 45

## 2021-10-12 MED ORDER — FENTANYL CITRATE (PF) 100 MCG/2ML IJ SOLN
INTRAMUSCULAR | Status: AC
  Filled 2021-10-12: qty 2

## 2021-10-12 MED ORDER — PHENYLEPHRINE 40 MCG/ML (10ML) SYRINGE FOR IV PUSH (FOR BLOOD PRESSURE SUPPORT)
PREFILLED_SYRINGE | INTRAVENOUS | Status: AC
  Filled 2021-10-12: qty 10

## 2021-10-12 MED ORDER — POLYETHYLENE GLYCOL 3350 17 G PO PACK
17.0000 g | PACK | Freq: Every day | ORAL | Status: DC
  Administered 2021-10-13: 17 g
  Filled 2021-10-12 (×2): qty 1

## 2021-10-12 MED ORDER — NOREPINEPHRINE 4 MG/250ML-% IV SOLN
2.0000 ug/min | INTRAVENOUS | Status: DC
  Administered 2021-10-12: 2 ug/min via INTRAVENOUS
  Administered 2021-10-12: 8 ug/min via INTRAVENOUS
  Administered 2021-10-13: 5 ug/min via INTRAVENOUS
  Filled 2021-10-12 (×3): qty 250

## 2021-10-12 MED ORDER — SODIUM CHLORIDE 0.9 % IV SOLN
250.0000 mL | INTRAVENOUS | Status: DC
  Administered 2021-10-12 – 2021-10-21 (×8): 250 mL via INTRAVENOUS

## 2021-10-12 MED ORDER — MIDAZOLAM HCL 2 MG/2ML IJ SOLN
INTRAMUSCULAR | Status: AC
  Administered 2021-10-12: 2 mg
  Filled 2021-10-12: qty 2

## 2021-10-12 MED ORDER — NEPRO/CARBSTEADY PO LIQD
1000.0000 mL | ORAL | Status: DC
  Administered 2021-10-12 – 2021-10-13 (×2): 1000 mL
  Filled 2021-10-12: qty 1000

## 2021-10-12 MED ORDER — FENTANYL CITRATE (PF) 100 MCG/2ML IJ SOLN
100.0000 ug | Freq: Once | INTRAMUSCULAR | Status: AC
  Administered 2021-10-12: 100 ug via INTRAVENOUS

## 2021-10-12 MED ORDER — ROCURONIUM BROMIDE 50 MG/5ML IV SOLN
1.0000 mg/kg | Freq: Once | INTRAVENOUS | Status: AC
Start: 2021-10-12 — End: 2021-10-12
  Filled 2021-10-12: qty 5.48

## 2021-10-12 MED ORDER — FENTANYL BOLUS VIA INFUSION
50.0000 ug | INTRAVENOUS | Status: DC | PRN
  Administered 2021-10-12: 50 ug via INTRAVENOUS
  Administered 2021-10-12: 75 ug via INTRAVENOUS
  Administered 2021-10-13: 50 ug via INTRAVENOUS
  Administered 2021-10-13: 100 ug via INTRAVENOUS
  Administered 2021-10-13 – 2021-10-14 (×3): 50 ug via INTRAVENOUS
  Filled 2021-10-12: qty 100

## 2021-10-12 MED ORDER — DEXMEDETOMIDINE HCL IN NACL 200 MCG/50ML IV SOLN
0.0000 ug/kg/h | INTRAVENOUS | Status: DC
  Administered 2021-10-12: 0.4 ug/kg/h via INTRAVENOUS
  Filled 2021-10-12 (×2): qty 50

## 2021-10-12 MED ORDER — SODIUM CHLORIDE 0.9% FLUSH
10.0000 mL | INTRAVENOUS | Status: DC | PRN
  Administered 2021-10-19: 10 mL

## 2021-10-12 MED ORDER — SODIUM CHLORIDE 0.9% FLUSH
10.0000 mL | Freq: Two times a day (BID) | INTRAVENOUS | Status: DC
  Administered 2021-10-12 – 2021-10-15 (×7): 10 mL
  Administered 2021-10-16: 40 mL
  Administered 2021-10-16: 30 mL
  Administered 2021-10-17 – 2021-10-18 (×3): 10 mL
  Administered 2021-10-18: 30 mL
  Administered 2021-10-19 – 2021-10-20 (×2): 10 mL
  Administered 2021-10-20 – 2021-10-21 (×2): 40 mL
  Administered 2021-10-21: 10 mL
  Administered 2021-10-22: 40 mL
  Administered 2021-10-22 – 2021-10-23 (×2): 10 mL

## 2021-10-12 MED ORDER — JUVEN PO PACK
1.0000 | PACK | Freq: Two times a day (BID) | ORAL | Status: DC
  Administered 2021-10-12 – 2021-10-13 (×3): 1
  Filled 2021-10-12 (×3): qty 1

## 2021-10-12 MED ORDER — ETOMIDATE 2 MG/ML IV SOLN: 20.0000 mg | Freq: Once | INTRAVENOUS | Status: AC

## 2021-10-12 MED ORDER — FENTANYL CITRATE (PF) 100 MCG/2ML IJ SOLN
50.0000 ug | Freq: Once | INTRAMUSCULAR | Status: AC
  Administered 2021-10-12: 50 ug via INTRAVENOUS
  Filled 2021-10-12: qty 2

## 2021-10-12 MED ORDER — PROSOURCE TF PO LIQD
90.0000 mL | Freq: Two times a day (BID) | ORAL | Status: DC
  Administered 2021-10-12 – 2021-10-13 (×3): 90 mL
  Filled 2021-10-12 (×2): qty 90

## 2021-10-12 MED ORDER — ALBUMIN HUMAN 25 % IV SOLN
50.0000 g | Freq: Once | INTRAVENOUS | Status: AC
  Administered 2021-10-12: 50 g via INTRAVENOUS

## 2021-10-12 MED ORDER — ORAL CARE MOUTH RINSE
15.0000 mL | OROMUCOSAL | Status: DC
  Administered 2021-10-12 – 2021-10-16 (×40): 15 mL via OROMUCOSAL

## 2021-10-12 MED ORDER — ETOMIDATE 2 MG/ML IV SOLN
INTRAVENOUS | Status: AC
  Administered 2021-10-12: 20 mg
  Filled 2021-10-12: qty 20

## 2021-10-12 MED ORDER — DOCUSATE SODIUM 50 MG/5ML PO LIQD
100.0000 mg | Freq: Two times a day (BID) | ORAL | Status: DC
  Administered 2021-10-12 – 2021-10-13 (×2): 100 mg
  Filled 2021-10-12 (×3): qty 10

## 2021-10-12 MED ORDER — VITAL HIGH PROTEIN PO LIQD: 1000.0000 mL | ORAL | Status: DC

## 2021-10-12 MED ORDER — MIDAZOLAM HCL 2 MG/2ML IJ SOLN: 2.0000 mg | Freq: Once | INTRAMUSCULAR | Status: AC

## 2021-10-12 MED ORDER — CHLORHEXIDINE GLUCONATE 0.12% ORAL RINSE (MEDLINE KIT)
15.0000 mL | Freq: Two times a day (BID) | OROMUCOSAL | Status: DC
  Administered 2021-10-12 – 2021-10-16 (×9): 15 mL via OROMUCOSAL

## 2021-10-12 NOTE — Procedures (Signed)
Intubation Procedure Note  Brandon Bond  [id number]  [birth year]  Date:10/12/21  Time:9:39 AM   Provider Performing:Brandon Bond    Procedure: Intubation (31500)  Indication(s) Respiratory Failure  Consent Risks of the procedure as well as the alternatives and risks of each were explained to the patient and/or caregiver.  Consent for the procedure was obtained and is signed in the bedside chart   Anesthesia Etomidate, Versed, Fentanyl, and Rocuronium   Time Out Verified patient identification, verified procedure, site/side was marked, verified correct patient position, special equipment/implants available, medications/allergies/relevant history reviewed, required imaging and test results available.   Sterile Technique Usual hand hygeine, masks, and gloves were used   Procedure Description Patient positioned in bed supine.  Sedation given as noted above.  Patient was intubated with endotracheal tube using Glidescope.  View was Grade 1 full glottis .  Number of attempts was 1.  Colorimetric CO2 detector was consistent with tracheal placement.   Complications/Tolerance None; patient tolerated the procedure well. Chest X-ray is ordered to verify placement.   EBL Minimal   Specimen(s) None  Brandon Nash, DO [facility] 10/12/2021 9:39 AM

## 2021-10-12 NOTE — Evaluation (Signed)
SLP Cancellation Note  Patient Details Name: Brandon Bond MRN: [id number] DOB: 10/12/1960   Cancelled treatment:       Reason Eval/Treat Not Completed: Other (comment);Medical issues which prohibited therapy (pt intubated at this time, will sign off, please reconsult when/if desired) Rolena Infante, MS [facility] SLP Acute Rehab Services Office [phone] Pager [phone]   Chales Abrahams 10/12/2021, 11:18 AM

## 2021-10-12 NOTE — Progress Notes (Signed)
Pt NT suctioned x2 for moderate to large thick tan secretions.  Pt tolerated well.  Pt hyperoxygenated on 15L NRB prior to suctioning.  RN assisted at bedside.

## 2021-10-12 NOTE — Progress Notes (Signed)
Chaplain engaged in an initial visit with Mrs. Smolinsky.  Mrs. Zinn detailed Promise's health journey and his decline since having an ankle injury before COVID.  Mrs. Dunnigan voiced that Gotti began to deal with a lot of depression and anxiety.  Isolation during COVID and being immobile due to his injury left Damaris in a bad place physically and mentally.  Mrs. Brayman stated she tried to get Ansley the help he needed on multiple occassions but he would refuse or would not give consent.  Chaplain worked to affirm that Mrs. Drumwright has done all she can to be there for him.    Mrs. Hurta stated that they have been together for 20 years.  They met while doing karaoke and became best friends first.  Mrs. Bonura is afraid of losing her best friend.  Chaplain affirmed her fear and grief and realized how Mrs. Ahlgren is already prepping her mind if she does lose him.  Chaplain encouraged hope and the unknown.  Mrs. Wordell is in a realistic place of understanding where Shaya truly is in his health. Chaplain encouraged Mrs. Pember to continue to talk to [place] at the bedside as well as surround his with things he loves or recognizes like music.  She stated that Bharat loves the Beatles and that they have a song that represents their love.  Chaplain let her know it would be ok to play those songs around him.   Chaplain offered listening, presence, prayer, and support to Mrs. Narang.     10/12/21 1200  Clinical Encounter Type  Visited With Family  Visit Type Initial;Spiritual support  Spiritual Encounters  Spiritual Needs Prayer;Grief support;Emotional

## 2021-10-12 NOTE — Progress Notes (Addendum)
PT Cancellation Note  Patient Details Name: Jonmichael Beadnell MRN: [id number] DOB: 13-Apr-1961   Cancelled Treatment:    Reason Eval/Treat Not Completed: Medical issues which prohibited therapy (pt intubated at this time,will sign off at this time. please re-consult when pt is medically ready to participate in PT)   Renaldo Fiddler PT, DPT Acute Rehabilitation Services Office [phone] Pager [phone]

## 2021-10-12 NOTE — TOC Progression Note (Signed)
Transition of Care [facility]) - Progression Note    Patient Details  Name: Brandon Bond MRN: [id number] Date of Birth: [birth year]  Transition of Care [facility]) CM/SW Contact  Golda Acre, RN Phone Number: 10/12/2021, 7:59 AM  Clinical Narrative:    Following for toc needs.  Will call aps concerning home situationand condition of patient.  Have not been able to contact the sig. Other listed.   Expected Discharge Plan: Home/Self Care Barriers to Discharge: Continued Medical Work up, Other (must enter comment) (personal hygiene issues)  Expected Discharge Plan and Services Expected Discharge Plan: Home/Self Care   Discharge Planning Services: CM Consult   Living arrangements for the past 2 months: Single Family Home                                       Social Determinants of Health (SDOH) Interventions    Readmission Risk Interventions No flowsheet data found.

## 2021-10-12 NOTE — Progress Notes (Signed)
Initial Nutrition Assessment  DOCUMENTATION CODES:   Severe malnutrition in context of chronic illness, Underweight  INTERVENTION:  - once/ OGT/NGT able to be placed: Nepro @ 15 ml/hr to advance by 10 ml every 24 hours to reach goal rate of 35 ml/hr with 90 ml Prosource TF BID and 1 packet Juven BID.  - at goal rate, this regimen will provide 1862 kcal, 117 grams protein, and 611 ml free water.   NUTRITION DIAGNOSIS:   Severe Malnutrition related to chronic illness as evidenced by severe fat depletion, severe muscle depletion.  GOAL:   Patient will meet greater than or equal to 90% of their needs  MONITOR:   Vent status, TF tolerance, Labs, Weight trends, Skin  REASON FOR ASSESSMENT:   Ventilator, Consult Enteral/tube feeding initiation and management  ASSESSMENT:   61 year old white male with no medical history in our EMR. He presented to the ED due to AMS and weakness x2-3 days. EMS reported that patient was found in a recliner soiled with urine and stool. In the ED, CT head was negative for acute findings and CT chest/abdomen/pelvis showed bilateral lower lobe PNA.  Patient discussed in rounds this AM and with RN after RD visit to patient's room this afternoon. Patient remains intubated since this AM with no OGT/NGT in place. Attempt at placement of tube earlier this shift led to significant decrease in heart rate.   No visitors present at the time of RD visit.   Patient has not been seen by a [place] RD at any time in the past.   Weight yesterday was 121 lb and PTA the only other recorded weight was 228 lb on 05/17/2017 at [facility]. This would indicate a 107 lb weight loss (47% body weight) in 4 and a half years.  He is noted to be +5.4 L since admission.   May need to re-estimate nutrition needs in the future once a more accurate dry weight is available.    Patient is currently intubated on ventilator support MV: 10.6 L/min Temp (24hrs), Avg:98.9 F (37.2 C),  Min:97.5 F (36.4 C), Max:99.9 F (37.7 C) Propofol: none  Labs reviewed; BUN: 77 mg/dl, creatinine: 1.49 mg/dl, Phos: 6.2 mg/dl, Mg: 2.9 mg/dl, GFR: 53 ml/min.  Medications reviewed; 100 mg colace BID, 40 mg protonix/day, 17 g miralax/day, 10 mEq IV KCl x1 run 2/22, 100 mg IV remdesivir x1 dose/day (2/22-2/25)  Drips; levo @ 8 mcg/min, fentanyl @ 100 mcg/hr.  IVF; D5 @ 100 ml/hr (408 kcal/24 hrs).    NUTRITION - FOCUSED PHYSICAL EXAM:  Flowsheet Row Most Recent Value  Orbital Region Severe depletion  Upper Arm Region Severe depletion  Thoracic and Lumbar Region Severe depletion  Buccal Region Unable to assess  [ETT holder]  Temple Region Severe depletion  Clavicle Bone Region Severe depletion  Clavicle and Acromion Bone Region Severe depletion  Scapular Bone Region Severe depletion  Dorsal Hand Unable to assess  [mitten restraints]  Patellar Region Mild depletion  Anterior Thigh Region Mild depletion  Posterior Calf Region Mild depletion  Edema (RD Assessment) Moderate  [BLE up to knees]  Hair Reviewed  Eyes Unable to assess  Mouth Unable to assess  Skin Reviewed  Nails Unable to assess       Diet Order:   Diet Order             Diet NPO time specified  Diet effective now  EDUCATION NEEDS:   No education needs have been identified at this time  Skin:  Skin Assessment: Skin Integrity Issues: Skin Integrity Issues:: Stage II, Unstageable, DTI DTI: scrotum Stage II: R IT; vertebral column Unstageable: full thickness to sacrum, L IT, and vertebral column  Last BM:  2/22 (type 4, small amount)  Height:   Ht Readings from Last 1 Encounters:  10/12/21 5\' 11"  (1.803 m)    Weight:   Wt Readings from Last 1 Encounters:  10/11/21 54.8 kg     BMI:  Body mass index is 16.85 kg/m.  Estimated Nutritional Needs:  Kcal:  1736 kcal Protein:  110-137 grams Fluid:  >/= 2.2 L/day     Jarome Matin, MS, RD, LDN Inpatient Clinical  Dietitian RD pager # available in [place]  After hours/weekend pager # available in [facility]

## 2021-10-12 NOTE — Progress Notes (Addendum)
° ° °  OVERNIGHT PROGRESS REPORT  While rounding in ICU/SDU patient exhibited increasing work of breathing. NT suction ordered. NT suctioned x2 for moderate to large thick tan secretions. ABG was drawn yielding the following:    Latest Reference Range & Units 10/12/21 01:45  pH, Arterial 7.35 - 7.45  7.07 (LL)  pCO2 arterial 32 - 48 mmHg 85 (HH)  pO2, Arterial 83 - 108 mmHg 88  Acid-base deficit 0.0 - 2.0 mmol/L 7.5 (H)  Bicarbonate 20.0 - 28.0 mmol/L 24.6  O2 Saturation % 91.2  Patient temperature  37.6  Allens test (pass/fail) PASS  PASS   SPO2 correlates with ABG.  BiPAP is now ordered for Respiratory Acidosis and improvement is noted in SPO2, BP and HR.  ABG will be redrawn at 0500.    Follow up ABG :   Latest Reference Range & Units 10/12/21 05:25  pH, Arterial 7.35 - 7.45  7.17 (LL)  pCO2 arterial 32 - 48 mmHg 66 (HH)  pO2, Arterial 83 - 108 mmHg 158 (H)  Acid-base deficit 0.0 - 2.0 mmol/L 5.6 (H)  Bicarbonate 20.0 - 28.0 mmol/L 24.1  O2 Saturation % 100  Patient temperature  36.9  Collection site  RIGHT RADIAL  Allens test (pass/fail) PASS  PASS    Work of breathing has improved.  Temp has normalized and vitals have returned to otherwise normal limits. Follow up ABG is scheduled for 0900    Chinita Greenland MSNA MSN ACNPC-AG Acute Care Nurse Practitioner [facility]

## 2021-10-12 NOTE — Progress Notes (Signed)
PCCM Progress Note   Emergent consent obtained for placement of PICC line given urgent need for line and limited access to legal decision maker for patient   Selene Peltzer D. Tiburcio Pea, NP-C [facility] Personal contact information can be found on Amion  10/12/2021, 4:31 PM

## 2021-10-12 NOTE — Consult Note (Signed)
NAME:  Brandon Bond, MRN:  [id number], DOB:  08/15/1961, LOS: 2 ADMISSION DATE:  09/28/2021, CONSULTATION DATE:  10/12/2021 REFERRING MD:  Dr. Aileen Fass, CHIEF COMPLAINT:  Acute respiratory distress    History of Present Illness:  Brandon Bond is a 61 year old male with no reported past medical history who presented to the emergency department due to altered mental status that began 3 days prior to admission.  Per report EMS found patient soiled in stool and urine in her recliner.  He was seen tachycardic and hypoxic with saturation 70% on room air.  On ED arrival patient met sepsis criteria with acute organ dysfunction.  He was also found to be COVID-positive.  Head CT negative.  CT abdomen pelvis revealed bilateral lower lobe pneumonia.  By morning of 2/23 patient was seen with progressive hypoxia and acidosis despite max BiPAP settings resulting in PCCM consult  Pertinent  Medical History  None  Significant Hospital Events: Including procedures, antibiotic start and stop dates in addition to other pertinent events   2/20 admitted with altered mental status found to be COVID-positive placed on BiPAP 2/23 failed BiPAP resulting in PCCM consult and intubation  Interim History / Subjective:  As above  Objective   Blood pressure 92/64, pulse (!) 110, temperature 98.2 F (36.8 C), temperature source Bladder, resp. rate (!) 26, height $RemoveBe'5\' 11"'DOOgMsNLF$  (1.803 m), weight 54.8 kg, SpO2 (!) 83 %.    FiO2 (%):  [100 %] 100 %   Intake/Output Summary (Last 24 hours) at 10/12/2021 0843 Last data filed at 10/12/2021 0800 Gross per 24 hour  Intake 3118.77 ml  Output 1200 ml  Net 1918.77 ml   Filed Weights   09/20/2021 1836 10/11/21 0500  Weight: 63.5 kg 54.8 kg    Examination: General: Acute on chronic ill-appearing middle-aged male lying in bed in mild distress HEENT: [place]/AT, MM pink/moist, PERRL,  Neuro: Altered on BIPAP  CV: s1s2 regular rate and rhythm, no murmur, rubs, or gallops,  PULM:   Diminished bilaterally, tachypnea with increased work of breathing on BiPAP GI: soft, bowel sounds active in all 4 quadrants, non-tender, non-distended Extremities: warm/dry, no edema  Skin: no rashes or lesions  Resolved Hospital Problem list   Hypovolemic hyponatremia  Assessment & Plan:  Severe sepsis secondary to pneumonia -On admission patient met sepsis criteria given hypothermia, tachypnea, and eventual development of tachycardia.  Lab work also revealed significant leukocytosis with severely elevated lactic acid P: Remains critically ill in the ICU  Patient needs emergent intubation Follow cultures Continue IV cefepime MAP goal greater than 65 Change lactic acid Trend lactic acid  Acute Hypoxic and hypercapnic Respiratory Failure  -Secondary to pneumonia P: Continue ventilator support with lung protective strategies  Wean PEEP and FiO2 for sats greater than 90%. Head of bed elevated 30 degrees. Plateau pressures less than 30 cm H20.  Follow intermittent chest x-ray and ABG.   SAT/SBT as tolerated, mentation preclude extubation  Ensure adequate pulmonary hygiene  Follow cultures  VAP bundle in place  PAD protocol  Acute metabolic encephalopathy -Likely multifactorial including sepsis and hypoxia P: Maintain neuro protective measures; goal for eurothermia, euglycemia, eunatermia, normoxia, and PCO2 goal of 35-40 Nutrition and bowel regiment  Seizure precautions  Aspirations precautions  PAD protocol Delirium precautions  Acute renal failure -Creatinine on admission 3.54, GFR 19 renal function improving with medical management P: Follow renal function  Monitor urine output Trend Bmet Avoid nephrotoxins Ensure adequate renal perfusion  IV hydration  Elevated LFTs -  improving P: Trend LFTs Avoid hepatotoxins  Metabolic derangements: Hypokalemia P: Supplement as needed Trend to bmet  Multiple pressure ulcers on admit including sacral, ischial  tuberosity, vertebral column P: Local wound care Optimize nutrition Pressure alleviating devices  Best Practice (right click and "Reselect all SmartList Selections" daily)   Diet/type: tubefeeds DVT prophylaxis: Coumadin GI prophylaxis: PPI Lines: N/A Foley:  N/A Code Status:  full code Last date of multidisciplinary goals of care discussion: Family updated regarding need for endotracheal intubation and agreed to proceed  Labs   CBC: Recent Labs  Lab 09/23/2021 1827 10/10/21 0410 10/11/21 0250 10/12/21 0237  WBC 25.5* 21.0* 19.0* 30.0*  NEUTROABS 22.7* 19.1*  --   --   HGB 18.6* 15.0 15.4 15.4  HCT 56.1* 44.8 45.4 47.4  MCV 90.2 90.3 88.0 93.1  PLT 623* 391 289 417*    Basic Metabolic Panel: Recent Labs  Lab 09/24/2021 1827 10/10/21 0410 10/11/21 0844 10/12/21 0237  NA 151* 147* 143 145  K 4.5 3.4* 3.0* 4.7  CL 114* 116* 113* 115*  CO2 14* 19* 20* 22  GLUCOSE 142* 128* 148* 164*  BUN 126* 136* 97* 77*  CREATININE 3.54* 2.77* 1.55* 1.49*  CALCIUM 9.7 8.7* 8.5* 8.8*  MG 3.7* 2.7*  --   --    GFR: Estimated Creatinine Clearance: 40.4 mL/min (A) (by C-G formula based on SCr of 1.49 mg/dL (H)). Recent Labs  Lab 10/08/2021 1826 09/20/2021 1827 10/06/2021 2011 10/01/2021 2230 10/04/2021 2330 10/10/21 0410 10/10/21 1900 10/11/21 0250 10/12/21 0237  PROCALCITON  --   --   --   --   --   --  2.43  --   --   WBC  --  25.5*  --   --   --  21.0*  --  19.0* 30.0*  LATICACIDVEN 7.7*  --  6.4* 3.5* 2.5*  --   --   --   --     Liver Function Tests: Recent Labs  Lab 10/07/2021 1827 10/10/21 0410 10/11/21 0844  AST 213* <5* 70*  ALT 89* 139* 97*  ALKPHOS 86 63 58  BILITOT 1.7* 1.5* 1.0  PROT 7.8 5.6* 5.6*  ALBUMIN 3.3* 2.5* 2.2*   No results for input(s): LIPASE, AMYLASE in the last 168 hours. No results for input(s): AMMONIA in the last 168 hours.  ABG    Component Value Date/Time   PHART 7.17 (LL) 10/12/2021 0525   PCO2ART 66 (HH) 10/12/2021 0525   PO2ART 158  (H) 10/12/2021 0525   HCO3 24.1 10/12/2021 0525   ACIDBASEDEF 5.6 (H) 10/12/2021 0525   O2SAT 100 10/12/2021 0525     Coagulation Profile: Recent Labs  Lab 10/05/2021 1827  INR 1.3*    Cardiac Enzymes: Recent Labs  Lab 10/11/21 0844  CKTOTAL 103    HbA1C: No results found for: HGBA1C  CBG: No results for input(s): GLUCAP in the last 168 hours.  Review of Systems:   Unable to assess given critical illness  Past Medical History:  He,  has no past medical history on file.   Surgical History:  History reviewed. No pertinent surgical history.   Social History:      Family History:  His Family history is unknown by patient.   Allergies No Known Allergies   Home Medications  Prior to Admission medications   Not on File     Critical care time:   CRITICAL CARE Performed by: Kentaro Alewine D. Harris  Total critical care time: 42 minutes  Critical care time was exclusive of separately billable procedures and treating other patients.  Critical care was necessary to treat or prevent imminent or life-threatening deterioration.  Critical care was time spent personally by me on the following activities: development of treatment plan with patient and/or surrogate as well as nursing, discussions with consultants, evaluation of patient's response to treatment, examination of patient, obtaining history from patient or surrogate, ordering and performing treatments and interventions, ordering and review of laboratory studies, ordering and review of radiographic studies, pulse oximetry and re-evaluation of patient's condition.  Arlyn Bumpus D. Kenton Kingfisher, NP-C [facility] Personal contact information can be found on Amion  10/12/2021, 9:13 AM

## 2021-10-12 NOTE — Progress Notes (Signed)
Peripherally Inserted Central Catheter Placement  The IV Nurse has discussed with the patient and/or persons authorized to consent for the patient, the purpose of this procedure and the potential benefits and risks involved with this procedure.  The benefits include less needle sticks, lab draws from the catheter, and the patient may be discharged home with the catheter. Risks include, but not limited to, infection, bleeding, blood clot (thrombus formation), and puncture of an artery; nerve damage and irregular heartbeat and possibility to perform a PICC exchange if needed/ordered by physician.  Alternatives to this procedure were also discussed.  Bard Power PICC patient education guide, fact sheet on infection prevention and patient information card has been provided to patient /or left at bedside.    PICC Placement Documentation  PICC Triple Lumen 10/12/21 Right Basilic 37 cm 0 cm (Active)  Indication for Insertion or Continuance of Line Prolonged intravenous therapies 10/12/21 1744  Exposed Catheter (cm) 0 cm 10/12/21 1744  Site Assessment Clean, Dry, Intact 10/12/21 1744  Lumen #1 Status Flushed;Blood return noted;Saline locked 10/12/21 1744  Lumen #2 Status Flushed;Blood return noted;Saline locked 10/12/21 1744  Lumen #3 Status Flushed;Blood return noted;Saline locked 10/12/21 1744  Dressing Type Transparent 10/12/21 1744  Dressing Status Antimicrobial disc in place 10/12/21 1744  Dressing Change Due 11/07/2021 10/12/21 1744       Brandon Bond 10/12/2021, 5:47 PM
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
## 2021-10-13 ENCOUNTER — Inpatient Hospital Stay (HOSPITAL_COMMUNITY): Payer: 59

## 2021-10-13 DIAGNOSIS — J80 Acute respiratory distress syndrome: Secondary | ICD-10-CM | POA: Diagnosis not present

## 2021-10-13 DIAGNOSIS — A419 Sepsis, unspecified organism: Secondary | ICD-10-CM | POA: Diagnosis not present

## 2021-10-13 DIAGNOSIS — R6521 Severe sepsis with septic shock: Secondary | ICD-10-CM

## 2021-10-13 DIAGNOSIS — N179 Acute kidney failure, unspecified: Secondary | ICD-10-CM | POA: Diagnosis not present

## 2021-10-13 DIAGNOSIS — U071 COVID-19: Secondary | ICD-10-CM | POA: Diagnosis not present

## 2021-10-13 LAB — BASIC METABOLIC PANEL
Anion gap: 8 (ref 5–15)
BUN: 94 mg/dL — ABNORMAL HIGH (ref 8–23)
CO2: 21 mmol/L — ABNORMAL LOW (ref 22–32)
Calcium: 8.1 mg/dL — ABNORMAL LOW (ref 8.9–10.3)
Chloride: 108 mmol/L (ref 98–111)
Creatinine, Ser: 2.1 mg/dL — ABNORMAL HIGH (ref 0.61–1.24)
GFR, Estimated: 35 mL/min — ABNORMAL LOW (ref >60)
Glucose, Bld: 245 mg/dL — ABNORMAL HIGH (ref 70–99)
Potassium: 4.3 mmol/L (ref 3.5–5.1)
Sodium: 137 mmol/L (ref 135–145)

## 2021-10-13 LAB — CBC
HCT: 36.4 % — ABNORMAL LOW (ref 39.0–52.0)
Hemoglobin: 11.9 g/dL — ABNORMAL LOW (ref 13.0–17.0)
MCH: 29.8 pg (ref 26.0–34.0)
MCHC: 32.7 g/dL (ref 30.0–36.0)
MCV: 91.2 fL (ref 80.0–100.0)
Platelets: 222 10*3/uL (ref 150–400)
RBC: 3.99 MIL/uL — ABNORMAL LOW (ref 4.22–5.81)
RDW: 15.4 % (ref 11.5–15.5)
WBC: 22.8 10*3/uL — ABNORMAL HIGH (ref 4.0–10.5)
nRBC: 0.1 % (ref 0.0–0.2)

## 2021-10-13 LAB — PHOSPHORUS: Phosphorus: 3.4 mg/dL (ref 2.5–4.6)

## 2021-10-13 LAB — GLUCOSE, CAPILLARY
Glucose-Capillary: 109 mg/dL — ABNORMAL HIGH (ref 70–99)
Glucose-Capillary: 120 mg/dL — ABNORMAL HIGH (ref 70–99)
Glucose-Capillary: 122 mg/dL — ABNORMAL HIGH (ref 70–99)
Glucose-Capillary: 131 mg/dL — ABNORMAL HIGH (ref 70–99)
Glucose-Capillary: 137 mg/dL — ABNORMAL HIGH (ref 70–99)
Glucose-Capillary: 140 mg/dL — ABNORMAL HIGH (ref 70–99)

## 2021-10-13 LAB — MAGNESIUM: Magnesium: 2.3 mg/dL (ref 1.7–2.4)

## 2021-10-13 MED ORDER — NEPRO/CARBSTEADY PO LIQD
1000.0000 mL | ORAL | Status: DC
  Administered 2021-10-13: 1000 mL
  Filled 2021-10-13 (×2): qty 1000

## 2021-10-13 MED ORDER — NOREPINEPHRINE 4 MG/250ML-% IV SOLN
0.0000 ug/min | INTRAVENOUS | Status: DC
  Administered 2021-10-13: 4 ug/min via INTRAVENOUS
  Administered 2021-10-14: 5 ug/min via INTRAVENOUS
  Administered 2021-10-15: 2 ug/min via INTRAVENOUS
  Administered 2021-10-16: 3 ug/min via INTRAVENOUS
  Filled 2021-10-13 (×6): qty 250

## 2021-10-13 MED ORDER — CHLORHEXIDINE GLUCONATE CLOTH 2 % EX PADS
6.0000 | MEDICATED_PAD | Freq: Every day | CUTANEOUS | Status: DC
  Administered 2021-10-13 – 2021-10-23 (×12): 6 via TOPICAL

## 2021-10-13 MED ORDER — LACTATED RINGERS IV SOLN: INTRAVENOUS | Status: DC

## 2021-10-13 NOTE — Progress Notes (Signed)
Echo attempted by JR at 3:30, patient unavailable for test. Will do first thing in the am 2/25

## 2021-10-13 NOTE — Progress Notes (Signed)
NUTRITION NOTE  Patient discussed in rounds this AM. OGT placed yesterday (not yet listed on LDA/avatar, RN alerted) and abdominal x-ray yesterday indicates tip of tube in proximal stomach, just below GE junction.   Patient is receiving Nepro @ 15 ml/hr with 90 ml Prosource TF BID and 1 packet Juven BID. Per rounds, plan to increase Nepro to goal rate of 35 ml/hr at this time per CCM request. At goal rate, Nepro @ 35 ml/hr with 90 ml Prosource TF BID and 1 packet Juven BID will provide 1862 kcal, 117 grams protein, and 611 ml free water.  He is receiving LR @ 75 ml/hr (1800 ml/24 hrs).  This AM serum K, Mg, and Phos were all WDL. If these values continue to trend down, will change to a different TF formula does not restrict these minerals.     Brandon Gammon, MS, RD, LDN Inpatient Clinical Dietitian RD pager # available in [facility]  After hours/weekend pager # available in [facility]

## 2021-10-13 NOTE — Progress Notes (Signed)
PHARMACY NOTE -  Cefepime  Pharmacy has been assisting with dosing of cefepime for PNA. Continue current dosing at 2g IV q12 hr; borderline CrCl, but will follow peripherally and make further renal adjustments per institutional Pharmacy antibiotic protocol  Pharmacy will sign off, following peripherally for culture results or dose adjustments. Please reconsult if a change in clinical status warrants re-evaluation of dosage.  Bernadene Person, PharmD, BCPS [phone] 10/13/2021, 9:02 AM

## 2021-10-13 NOTE — Plan of Care (Signed)
Noted trending decrease in 02 requirement. Pt's significant other at bedside.  Plan of care reviewed and family expressed an understanding Problem: Respiratory: Goal: Ability to maintain adequate ventilation will improve Outcome: Progressing Goal: Ability to maintain a clear airway will improve Outcome: Progressing   Problem: Respiratory: Goal: Ability to maintain a clear airway will improve Outcome: Progressing   Problem: Education: Goal: Knowledge of General Education information will improve Description: Including pain rating scale, medication(s)/side effects and non-pharmacologic comfort measures Outcome: Progressing   Problem: Clinical Measurements: Goal: Ability to maintain clinical measurements within normal limits will improve Outcome: Progressing

## 2021-10-13 NOTE — Progress Notes (Signed)
NAME:  Brandon Bond, MRN:  [id number], DOB:  [birth year], LOS: 3 ADMISSION DATE:  09/26/2021, CONSULTATION DATE:  10/12/2021 REFERRING MD:  Dr. Aileen Fass, CHIEF COMPLAINT:  Acute respiratory distress    History of Present Illness:  Brandon Bond is a 61 year old male with no reported past medical history who presented to the emergency department due to altered mental status that began 3 days prior to admission.  Per report EMS found patient soiled in stool and urine in her recliner.  He was seen tachycardic and hypoxic with saturation 70% on room air.  On ED arrival patient met sepsis criteria with acute organ dysfunction.  He was also found to be COVID-positive.  Head CT negative.  CT abdomen pelvis revealed bilateral lower lobe pneumonia.  By morning of 2/23 patient was seen with progressive hypoxia and acidosis despite max BiPAP settings resulting in PCCM consult  Pertinent  Medical History  None  Significant Hospital Events: Including procedures, antibiotic start and stop dates in addition to other pertinent events   2/20 admitted with altered mental status found to be COVID-positive placed on BiPAP 2/23 failed BiPAP resulting in PCCM consult and intubation  Interim History / Subjective:  Critically ill, intubated  On low-dose Levophed On 50%/PEEP 10 Low urine output    Objective   Blood pressure 94/78, pulse (!) 57, temperature 98.2 F (36.8 C), resp. rate 18, height 5' 11" (1.803 m), weight 54.8 kg, SpO2 97 %.    Vent Mode: PRVC FiO2 (%):  [50 %-60 %] 50 % Set Rate:  [18 bmp] 18 bmp Vt Set:  [600 mL] 600 mL PEEP:  [10 cmH20] 10 cmH20 Plateau Pressure:  [25 cmH20-27 cmH20] 26 cmH20   Intake/Output Summary (Last 24 hours) at 10/13/2021 1034 Last data filed at 10/13/2021 0258 Gross per 24 hour  Intake 1745.2 ml  Output 300 ml  Net 1445.2 ml    Filed Weights   10/01/2021 1836 10/11/21 0500  Weight: 63.5 kg 54.8 kg    Examination: General: Acute on chronic ill-appearing  middle-aged male lying in bed , sedated HEENT: Brandon Bond/AT, MM pink/moist, PERRL,  Neuro: Intubated, RASS -3 on fentanyl drip CV: s1s2 regular rate and rhythm, no murmur, rubs, or gallops,  PULM: No accessory muscle use, synchronous with vent, bilateral ventilated breath sounds  GI: soft, bowel sounds active in all 4 quadrants, non-tender, non-distended Extremities: warm/dry, no edema  Skin: no rashes or lesions  Chest x-ray 2/23 independently reviewed shows bibasal patchy infiltrates, improved in left lower lung field.  Labs show decrease leukocytosis, hyperglycemia, normal electrolytes , increasing creatinine from 1.5-2.1    Resolved Hospital Problem list   Hypovolemic hyponatremia  Assessment & Plan:  Septic shock secondary to pneumonia -On admission patient met sepsis criteria given hypothermia, tachypnea, and eventual development of tachycardia.  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 Head of bed elevated 30 degrees. Plateau pressures less than 30 cm H20, currently on 8 cc/kg based rectal pressure clinic 2, driving pressure is 12 , drop PEEP to 5 Follow intermittent chest x-ray and ABG.   SAT/SBT as tolerated, mentation preclude extubation  Ensure adequate pulmonary hygiene  VAP bundle in place  DuoNebs  Covid pneumonia -Remdesivir -Dexamethasone for 10 days -Consider baricitinib   Acute metabolic encephalopathy -Likely multifactorial including sepsis and hypoxia P: Maintain neuro protective measures; goal for eurothermia, euglycemia, eunatermia, normoxia, and PCO2  goal of 35-40 Aspirations precautions  PAD protocol -using fentanyl drip, goal RASS 0 to -1, did not tolerate Precedex due to bradycardia Delirium precautions  Acute renal failure -Creatinine on admission 3.54, GFR 19 renal function improving with medical management P: Follow renal function  Monitor urine output Trend Bmet Avoid nephrotoxins Change IV fluids to LR  Elevated LFTs - improving P: Trend LFTs Avoid hepatotoxins   Multiple pressure ulcers present PTA including sacral, ischial tuberosity, vertebral column  P: Local wound care Optimize nutrition   Best Practice (right click and "Reselect all SmartList Selections" daily)   Diet/type: tubefeeds DVT prophylaxis: prophylactic heparin  GI prophylaxis: PPI Lines: N/A Foley:  N/A Code Status:  full code Last date of multidisciplinary goals of care discussion: Wife updated  Labs   CBC: Recent Labs  Lab 09/20/2021 1827 10/10/21 0410 10/11/21 0250 10/12/21 0237 10/13/21 0231  WBC 25.5* 21.0* 19.0* 30.0* 22.8*  NEUTROABS 22.7* 19.1*  --   --   --   HGB 18.6* 15.0 15.4 15.4 11.9*  HCT 56.1* 44.8 45.4 47.4 36.4*  MCV 90.2 90.3 88.0 93.1 91.2  PLT 623* 391 289 417* 222     Basic Metabolic Panel: Recent Labs  Lab 09/29/2021 1827 10/10/21 0410 10/11/21 0844 10/12/21 0237 10/12/21 1700 10/13/21 0231  NA 151* 147* 143 145  --  137  K 4.5 3.4* 3.0* 4.7  --  4.3  CL 114* 116* 113* 115*  --  108  CO2 14* 19* 20* 22  --  21*  GLUCOSE 142* 128* 148* 164*  --  245*  BUN 126* 136* 97* 77*  --  94*  CREATININE 3.54* 2.77* 1.55* 1.49*  --  2.10*  CALCIUM 9.7 8.7* 8.5* 8.8*  --  8.1*  MG 3.7* 2.7*  --  2.9* 2.5* 2.3  PHOS  --   --   --  6.2* 4.4 3.4    GFR: Estimated Creatinine Clearance: 28.6 mL/min (A) (by C-G formula based on SCr of 2.1 mg/dL (H)). Recent Labs  Lab 10/04/2021 1826 10/08/2021 1827 10/02/2021 2011 10/01/2021 2230 09/30/2021 2330 10/10/21 0410 10/10/21 1900 10/11/21 0250 10/12/21 0237  10/13/21 0231  PROCALCITON  --   --   --   --   --   --  2.43  --   --   --   WBC  --    < >  --   --   --  21.0*  --  19.0* 30.0* 22.8*  LATICACIDVEN 7.7*  --  6.4* 3.5* 2.5*  --   --   --   --   --    < > = values in this interval not displayed.     Liver Function Tests: Recent Labs  Lab 10/04/2021 1827 10/10/21 0410 10/11/21 0844  AST 213* <5* 70*  ALT 89* 139* 97*  ALKPHOS 86 63 58  BILITOT 1.7* 1.5* 1.0  PROT 7.8 5.6* 5.6*  ALBUMIN 3.3* 2.5* 2.2*    No results for input(s): LIPASE, AMYLASE in the last 168 hours. No results for input(s): AMMONIA in the last 168 hours.  ABG    Component Value Date/Time   PHART 7.3 (L) 10/12/2021 1102   PCO2ART 41 10/12/2021 1102   PO2ART 118 (H) 10/12/2021  1102   HCO3 20.7 10/12/2021 1102   ACIDBASEDEF 5.7 (H) 10/12/2021 1102   O2SAT 99.9 10/12/2021 1102      Coagulation Profile: Recent Labs  Lab 09/25/2021 1827  INR 1.3*     Cardiac Enzymes: Recent Labs  Lab 10/11/21 0844  CKTOTAL 103     HbA1C: No results found for: HGBA1C  CBG: Recent Labs  Lab 10/12/21 1841 10/12/21 2018 10/13/21 0000 10/13/21 0330 10/13/21 0810  GLUCAP 132* 126* 131* 120* 137*    Critical care time:   CRITICAL CARE Performed by: Leanna Sato Densil Ottey  Total critical care time: 40 minutes  Critical care time was exclusive of separately billable procedures and treating other patients.  Critical care was necessary to treat or prevent imminent or life-threatening deterioration.  Critical care was time spent personally by me on the following activities: development of treatment plan with patient and/or surrogate as well as nursing, discussions with consultants, evaluation of patient's response to treatment, examination of patient, obtaining history from patient or surrogate, ordering and performing treatments and interventions, ordering and review of laboratory studies, ordering and review of radiographic studies, pulse oximetry and re-evaluation  of patient's condition.    Kara Mead MD. Shade Flood. [facility] Pager : [phone]  If no response to pager , please call [phone] until 7 pm After 7:00 pm call Elink  [phone]    10/13/2021, 10:34 AM

## 2021-10-13 NOTE — Progress Notes (Signed)
Ms. [name], the patient's significant other was at the bedside. She said that the patient because of his anxiety has not been able to bathe for few weeks. Patient has a long beard because of that. Many things like his parents' dementia and his sister's bipolar disorder might have caused him to have anxiety. Ms. [name] said that patient is the most considerate and kind person and they are blessing to each other. Ms. [name] asked me to pray and we prayed together.   Chaplain Alvester Chou Brandon Bond

## 2021-10-14 ENCOUNTER — Inpatient Hospital Stay (HOSPITAL_COMMUNITY): Payer: 59

## 2021-10-14 DIAGNOSIS — A419 Sepsis, unspecified organism: Secondary | ICD-10-CM | POA: Diagnosis not present

## 2021-10-14 DIAGNOSIS — R6521 Severe sepsis with septic shock: Secondary | ICD-10-CM | POA: Diagnosis not present

## 2021-10-14 DIAGNOSIS — R0609 Other forms of dyspnea: Secondary | ICD-10-CM | POA: Diagnosis not present

## 2021-10-14 DIAGNOSIS — J9601 Acute respiratory failure with hypoxia: Secondary | ICD-10-CM | POA: Diagnosis not present

## 2021-10-14 DIAGNOSIS — N179 Acute kidney failure, unspecified: Secondary | ICD-10-CM | POA: Diagnosis not present

## 2021-10-14 LAB — COMPREHENSIVE METABOLIC PANEL
ALT: 27 U/L (ref 0–44)
AST: 25 U/L (ref 15–41)
Albumin: 1.9 g/dL — ABNORMAL LOW (ref 3.5–5.0)
Alkaline Phosphatase: 56 U/L (ref 38–126)
Anion gap: 9 (ref 5–15)
BUN: 118 mg/dL — ABNORMAL HIGH (ref 8–23)
CO2: 17 mmol/L — ABNORMAL LOW (ref 22–32)
Calcium: 7.9 mg/dL — ABNORMAL LOW (ref 8.9–10.3)
Chloride: 105 mmol/L (ref 98–111)
Creatinine, Ser: 2.33 mg/dL — ABNORMAL HIGH (ref 0.61–1.24)
GFR, Estimated: 31 mL/min — ABNORMAL LOW (ref >60)
Glucose, Bld: 188 mg/dL — ABNORMAL HIGH (ref 70–99)
Potassium: 4 mmol/L (ref 3.5–5.1)
Sodium: 131 mmol/L — ABNORMAL LOW (ref 135–145)
Total Bilirubin: 0.8 mg/dL (ref 0.3–1.2)
Total Protein: 4.2 g/dL — ABNORMAL LOW (ref 6.5–8.1)

## 2021-10-14 LAB — PHOSPHORUS: Phosphorus: 3 mg/dL (ref 2.5–4.6)

## 2021-10-14 LAB — CBC
HCT: 30.4 % — ABNORMAL LOW (ref 39.0–52.0)
Hemoglobin: 10.2 g/dL — ABNORMAL LOW (ref 13.0–17.0)
MCH: 29.8 pg (ref 26.0–34.0)
MCHC: 33.6 g/dL (ref 30.0–36.0)
MCV: 88.9 fL (ref 80.0–100.0)
Platelets: 220 10*3/uL (ref 150–400)
RBC: 3.42 MIL/uL — ABNORMAL LOW (ref 4.22–5.81)
RDW: 15.6 % — ABNORMAL HIGH (ref 11.5–15.5)
WBC: 22.4 10*3/uL — ABNORMAL HIGH (ref 4.0–10.5)
nRBC: 0.1 % (ref 0.0–0.2)

## 2021-10-14 LAB — GLUCOSE, CAPILLARY
Glucose-Capillary: 110 mg/dL — ABNORMAL HIGH (ref 70–99)
Glucose-Capillary: 118 mg/dL — ABNORMAL HIGH (ref 70–99)
Glucose-Capillary: 127 mg/dL — ABNORMAL HIGH (ref 70–99)
Glucose-Capillary: 128 mg/dL — ABNORMAL HIGH (ref 70–99)
Glucose-Capillary: 131 mg/dL — ABNORMAL HIGH (ref 70–99)
Glucose-Capillary: 67 mg/dL — ABNORMAL LOW (ref 70–99)
Glucose-Capillary: 77 mg/dL (ref 70–99)
Glucose-Capillary: 85 mg/dL (ref 70–99)
Glucose-Capillary: 94 mg/dL (ref 70–99)

## 2021-10-14 LAB — ECHOCARDIOGRAM LIMITED
Height: 71 in
S' Lateral: 2 cm
Weight: 1932.99 oz

## 2021-10-14 LAB — MAGNESIUM: Magnesium: 2.1 mg/dL (ref 1.7–2.4)

## 2021-10-14 MED ORDER — DOCUSATE SODIUM 100 MG PO CAPS
100.0000 mg | ORAL_CAPSULE | Freq: Two times a day (BID) | ORAL | Status: DC
  Administered 2021-10-17: 100 mg via ORAL
  Filled 2021-10-14 (×2): qty 1

## 2021-10-14 MED ORDER — DEXTROSE 50 % IV SOLN
INTRAVENOUS | Status: AC
  Administered 2021-10-14: 25 mL
  Filled 2021-10-14: qty 50

## 2021-10-14 MED ORDER — FENTANYL CITRATE (PF) 100 MCG/2ML IJ SOLN
25.0000 ug | INTRAMUSCULAR | Status: DC | PRN
  Administered 2021-10-14: 100 ug via INTRAVENOUS
  Administered 2021-10-14: 50 ug via INTRAVENOUS
  Administered 2021-10-15 (×2): 100 ug via INTRAVENOUS
  Filled 2021-10-14 (×3): qty 2

## 2021-10-14 MED ORDER — FUROSEMIDE 10 MG/ML IJ SOLN
40.0000 mg | Freq: Once | INTRAMUSCULAR | Status: AC
  Administered 2021-10-14: 40 mg via INTRAVENOUS
  Filled 2021-10-14: qty 4

## 2021-10-14 MED ORDER — PANTOPRAZOLE SODIUM 40 MG PO TBEC: 40.0000 mg | DELAYED_RELEASE_TABLET | Freq: Every day | ORAL | Status: DC

## 2021-10-14 MED ORDER — FENTANYL CITRATE (PF) 100 MCG/2ML IJ SOLN
INTRAMUSCULAR | Status: AC
  Administered 2021-10-14: 100 ug via INTRAVENOUS
  Filled 2021-10-14: qty 2

## 2021-10-14 MED ORDER — POLYETHYLENE GLYCOL 3350 17 G PO PACK
17.0000 g | PACK | Freq: Every day | ORAL | Status: DC
Start: 2021-10-15 — End: 2021-10-19
  Filled 2021-10-14: qty 1

## 2021-10-14 MED ORDER — SODIUM CHLORIDE 0.9 % IV SOLN
2.0000 g | INTRAVENOUS | Status: AC
  Administered 2021-10-15 – 2021-10-17 (×3): 2 g via INTRAVENOUS
  Filled 2021-10-14 (×3): qty 2

## 2021-10-14 MED ORDER — JUVEN PO PACK
1.0000 | PACK | Freq: Two times a day (BID) | ORAL | Status: DC
  Administered 2021-10-14 – 2021-10-16 (×3): 1 via ORAL
  Filled 2021-10-14 (×4): qty 1

## 2021-10-14 MED ORDER — FENTANYL 2500MCG IN NS 250ML (10MCG/ML) PREMIX INFUSION
0.0000 ug/h | INTRAVENOUS | Status: DC
  Administered 2021-10-14: 25 ug/h via INTRAVENOUS
  Administered 2021-10-15: 100 ug/h via INTRAVENOUS
  Filled 2021-10-14 (×2): qty 250

## 2021-10-14 NOTE — Progress Notes (Signed)
NAME:  Brandon Bond, MRN:  [id number], DOB:  October 02, 1960, LOS: 4 ADMISSION DATE:  10/12/2021, CONSULTATION DATE:  10/12/2021 REFERRING MD:  Dr. Aileen Fass, CHIEF COMPLAINT:  Acute respiratory distress    History of Present Illness:  Brandon Bond is a 61 year old male with no reported past medical history who presented to the emergency department due to altered mental status that began 3 days prior to admission.  Per report EMS found patient soiled in stool and urine in her recliner.  He was seen tachycardic and hypoxic with saturation 70% on room air.  On ED arrival patient met sepsis criteria with acute organ dysfunction.  He was also found to be COVID-positive.  Head CT negative.  CT abdomen pelvis revealed bilateral lower lobe pneumonia.  By morning of 2/23 patient was seen with progressive hypoxia and acidosis despite max BiPAP settings resulting in PCCM consult  Pertinent  Medical History  None  Significant Hospital Events: Including procedures, antibiotic start and stop dates in addition to other pertinent events   2/20 admitted with altered mental status found to be COVID-positive placed on BiPAP 2/23 failed BiPAP resulting in PCCM consult and intubation 2/24 PEEP dropped to 5  Interim History / Subjective:  intubated critically ill Remains on fentanyl drip with agitation when drip lowered Afebrile Urine output 600 cc last 24 hours On 40%/PEEP of 5    Objective   Blood pressure 124/81, pulse (!) 59, temperature 98.1 F (36.7 C), resp. rate 14, height $RemoveBe'5\' 11"'KiYwzlHgE$  (1.803 m), weight 54.8 kg, SpO2 95 %.    Vent Mode: PRVC FiO2 (%):  [40 %-50 %] 40 % Set Rate:  [18 bmp] 18 bmp Vt Set:  [600 mL] 600 mL PEEP:  [5 cmH20] 5 cmH20 Plateau Pressure:  [15 cmH20-35 cmH20] 35 cmH20   Intake/Output Summary (Last 24 hours) at 10/14/2021 1029 Last data filed at 10/14/2021 3007 Gross per 24 hour  Intake 3222.14 ml  Output 460 ml  Net 2762.14 ml    Filed Weights   09/26/2021 1836 10/11/21  0500  Weight: 63.5 kg 54.8 kg    Examination: General: Acute on chronic ill-appearing middle-aged male lying in bed , sedated HEENT: [place]/AT, MM pink/moist, PERRL,  Neuro: Intubated, RASS -2 on fentanyl drip CV: s1s2 regular rate and rhythm, no murmur, rubs, or gallops,  PULM: Bilateral ventilated breath sounds, minimal secretions Synchronous with vent,  GI: soft, bowel sounds active in all 4 quadrants, non-tender, non-distended Extremities: warm/dry, no edema  Skin: no rashes or lesions  Chest x-ray 2/25 independently reviewed shows bibasal patchy pneumonia  Labs show stable leukocytosis, hyperglycemia, normal electrolytes , rising BUN and creatinine    Resolved Hospital Problem list    Elevated LFTs   Assessment & Plan:  Septic shock secondary to pneumonia -On admission patient met sepsis criteria given hypothermia, tachypnea, and eventual development of tachycardia.  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 Head of bed elevated 30 degrees. Plateau pressures less than 30 cm H20, tolerated drop in peep SAT/SBT as tolerated, getting closer to extubation, may proceed with extubation to high flow nasal cannula Ensure adequate pulmonary hygiene  VAP  bundle in place  DuoNebs  Covid pneumonia -Remdesivir -Dexamethasone for 10 days -Not a candidate for baricitinib due to presumption of superimposed pneumonia   Acute metabolic encephalopathy -Likely multifactorial including sepsis and hypoxia P: Maintain neuro protective measures Aspirations precautions   PAD protocol -using fentanyl drip, goal RASS 0 to -1, did not tolerate Precedex due to bradycardia Delirium precautions  Acute renal failure -Creatinine on admission 3.54, creatinine improved to 1.5 and now worsening again.  BUN rising P: Follow renal function  Monitor urine output Trend Bmet Avoid nephrotoxins Change IV fluids to LR Lasix 40x1 to maintain anticipation of extubation Follow hyponatremia  Protein calorie malnutrition -Continue tube feeds  Multiple pressure ulcers present PTA including sacral, ischial tuberosity, vertebral column P: Local wound care Optimize nutrition   Best Practice (right click and "Reselect all SmartList Selections" daily)   Diet/type: tubefeeds DVT prophylaxis: prophylactic heparin  GI prophylaxis: PPI Lines: N/A Foley:  N/A Code Status:  full code Last date of multidisciplinary goals of care discussion: Wife updated 2/24  Labs   CBC: Recent Labs  Lab 10/03/2021 1827 10/10/21 0410 10/11/21 0250 10/12/21 0237 10/13/21 0231 10/14/21 0338  WBC 25.5* 21.0* 19.0* 30.0* 22.8* 22.4*  NEUTROABS 22.7* 19.1*  --   --   --   --   HGB 18.6* 15.0 15.4 15.4 11.9* 10.2*  HCT 56.1* 44.8 45.4 47.4 36.4* 30.4*  MCV 90.2 90.3 88.0 93.1 91.2 88.9  PLT 623* 391 289 417* 222 220     Basic Metabolic Panel: Recent Labs  Lab 10/10/21 0410 10/11/21 0844 10/12/21 0237 10/12/21 1700 10/13/21 0231 10/14/21 0338  NA 147* 143 145  --  137 131*  K 3.4* 3.0* 4.7  --  4.3 4.0  CL 116* 113* 115*  --  108 105  CO2 19* 20* 22  --  21* 17*  GLUCOSE 128* 148* 164*  --  245* 188*  BUN 136* 97* 77*  --  94* 118*  CREATININE 2.77* 1.55* 1.49*  --  2.10* 2.33*  CALCIUM 8.7* 8.5* 8.8*  --  8.1* 7.9*  MG 2.7*  --  2.9* 2.5* 2.3 2.1  PHOS  --   --  6.2* 4.4 3.4 3.0    GFR: Estimated Creatinine Clearance: 25.8 mL/min (A) (by C-G formula based on SCr of 2.33 mg/dL (H)). Recent Labs  Lab 10/15/2021 1826 10/12/2021 1827 09/23/2021 2011 10/03/2021 2230  09/29/2021 2330 10/10/21 0410 10/10/21 1900 10/11/21 0250 10/12/21 0237 10/13/21 0231 10/14/21 0338  PROCALCITON  --   --   --   --   --   --  2.43  --   --   --   --   WBC  --    < >  --   --   --    < >  --  19.0* 30.0* 22.8* 22.4*  LATICACIDVEN 7.7*  --  6.4* 3.5* 2.5*  --   --   --   --   --   --    < > = values in this interval not displayed.     Liver Function Tests: Recent Labs  Lab 10/08/2021 1827 10/10/21 0410 10/11/21 0844 10/14/21 0338  AST 213* <5* 70* 25  ALT 89* 139* 97* 27  ALKPHOS 86 63 58 56  BILITOT 1.7* 1.5* 1.0 0.8  PROT 7.8 5.6* 5.6* 4.2*  ALBUMIN 3.3* 2.5* 2.2* 1.9*    No results for input(s): LIPASE, AMYLASE in the last 168 hours. No results for input(s):  AMMONIA in the last 168 hours.  ABG    Component Value Date/Time   PHART 7.3 (L) 10/12/2021 1102   PCO2ART 41 10/12/2021 1102   PO2ART 118 (H) 10/12/2021 1102   HCO3 20.7 10/12/2021 1102   ACIDBASEDEF 5.7 (H) 10/12/2021 1102   O2SAT 99.9 10/12/2021 1102      Coagulation Profile: Recent Labs  Lab 10/01/2021 1827  INR 1.3*     Cardiac Enzymes: Recent Labs  Lab 10/11/21 0844  CKTOTAL 103     HbA1C: No results found for: HGBA1C  CBG: Recent Labs  Lab 10/13/21 1112 10/13/21 1511 10/13/21 2232 10/14/21 0514 10/14/21 0733  GLUCAP 140* 109* 122* 128* 131*     Critical care time:   CRITICAL CARE Performed by: Leanna Sato. Kayce Betty  Total critical care time: 45 minutes  Critical care time was exclusive of separately billable procedures and treating other patients.  Critical care was necessary to treat or prevent imminent or life-threatening deterioration.  Critical care was time spent personally by me on the following activities: development of treatment plan with patient and/or surrogate as well as nursing, discussions with consultants, evaluation of patient's response to treatment, examination of patient, obtaining history from patient or surrogate, ordering and performing  treatments and interventions, ordering and review of laboratory studies, ordering and review of radiographic studies, pulse oximetry and re-evaluation of patient's condition.    Kara Mead MD. Shade Flood. [facility] Pager : [phone]  If no response to pager , please call [phone] until 7 pm After 7:00 pm call Elink  [phone]    10/14/2021, 10:29 AM

## 2021-10-14 NOTE — Progress Notes (Signed)
During patient bath, nurse noted that patient was unable to tolerate turning during the bath. When patient was turned a drop in the heart rate from SR to brady in the low 40's was noted. Patient also dropped his pressure. No new orders at this time. Will continue to monitor.

## 2021-10-14 NOTE — Progress Notes (Signed)
Echocardiogram 2D Echocardiogram has been performed.  Warren Lacy Brandon Bond RDCS 10/14/2021, 9:23 AM

## 2021-10-14 NOTE — Progress Notes (Signed)
PHARMACY NOTE:  ANTIMICROBIAL RENAL DOSAGE ADJUSTMENT  Current antimicrobial regimen includes a mismatch between antimicrobial dosage and estimated renal function. As per policy approved by the Pharmacy & Therapeutics and Medical Executive Committees, the antimicrobial dosage will be adjusted accordingly.  Current antimicrobial and dosage:  Cefepime 2g q12 hr  Indication: PNA  Renal Function:   Estimated Creatinine Clearance: 25.8 mL/min (A) (by C-G formula based on SCr of 2.33 mg/dL (H)). []      On intermittent HD, scheduled: []      On CRRT    Antimicrobial dosage has been changed to:  Cefepime 2g q24 hr   Additional Comments: n/a   Thank you for allowing pharmacy to be a part of this patient's care.  , PharmD, BCPS [phone] 10/14/2021, 2:08 PM

## 2021-10-14 NOTE — Progress Notes (Addendum)
eLink Physician-Brief Progress Note Patient Name: Brandon Bond DOB: [birth year] MRN: [id number]   Date of Service  10/14/2021  HPI/Events of Note  Agitation Pt awake, biting tube, with resulting increase in BP .  Pt being planned for extubation, and prior fentanyl had been discontinued. He has been receiving all of his PRN fentanyl, however and these have not been effective.   eICU Interventions  Will restart fentanyl drip for pain control and sedation. Will place ceiling dose at 171mcg/hr, and plan to wean off in AM in preparation for SBT.  Precedex considered, but held off as HR already low at baseline.         Morelia Cassells M DELA CRUZ 10/14/2021, 11:34 PM   2:49AM Pt had new onset bright red blood per rectum.  HR was briefly elevated, now down to the 60s-70s. Remains normotensive, with MAP >65.  RN has just sent off CBC, BMP. Will add on type and screen, and check coags as well. Will follow results, give blood products as indicated.   Increased Protonix to 40mg  BID. PT will need GI evaluation in AM.   3:56AM Pt has been increasingly agitated, despite fentanyl at 139mcg/hr. Will increase ceiling dose to 16mcg/hr, and decrease interval between PRN fentanyl boluses.   Hgb and platelets stable.  Coags pending.

## 2021-10-14 NOTE — TOC Progression Note (Signed)
Transition of Care [facility]) - Progression Note    Patient Details  Name: Geffrey Michaelsen MRN: [id number] Date of Birth: [birth year]  Transition of Care [facility] CM/SW Contact  Tanna Loeffler, [place], [place] Phone Number: 10/14/2021, 1:00 PM  Clinical Narrative:     LCSW collaboration with representative at the [facility] of Social Services, [facility] [employer], in an attempt to file a report.  Representative took down LCSW's contact information and agreed to return call at her earliest convenience.  LCSW will await a return call to file report.  Expected Discharge Plan: Home/Self Care Barriers to Discharge: Continued Medical Work up  Expected Discharge Plan and Services Expected Discharge Plan: Home/Self Care   Discharge Planning Services: CM Consult   Living arrangements for the past 2 months: Single Family Home                                       Social Determinants of Health (SDOH) Interventions    Readmission Risk Interventions No flowsheet data found.

## 2021-10-15 ENCOUNTER — Inpatient Hospital Stay (HOSPITAL_COMMUNITY): Payer: 59

## 2021-10-15 ENCOUNTER — Encounter (HOSPITAL_COMMUNITY): Payer: Self-pay | Admitting: Internal Medicine

## 2021-10-15 DIAGNOSIS — A419 Sepsis, unspecified organism: Secondary | ICD-10-CM | POA: Diagnosis not present

## 2021-10-15 DIAGNOSIS — J9601 Acute respiratory failure with hypoxia: Secondary | ICD-10-CM | POA: Diagnosis not present

## 2021-10-15 DIAGNOSIS — R6521 Severe sepsis with septic shock: Secondary | ICD-10-CM | POA: Diagnosis not present

## 2021-10-15 DIAGNOSIS — N179 Acute kidney failure, unspecified: Secondary | ICD-10-CM | POA: Diagnosis not present

## 2021-10-15 LAB — BASIC METABOLIC PANEL
Anion gap: 9 (ref 5–15)
BUN: 139 mg/dL — ABNORMAL HIGH (ref 8–23)
CO2: 17 mmol/L — ABNORMAL LOW (ref 22–32)
Calcium: 7.8 mg/dL — ABNORMAL LOW (ref 8.9–10.3)
Chloride: 109 mmol/L (ref 98–111)
Creatinine, Ser: 2.42 mg/dL — ABNORMAL HIGH (ref 0.61–1.24)
GFR, Estimated: 30 mL/min — ABNORMAL LOW (ref >60)
Glucose, Bld: 76 mg/dL (ref 70–99)
Potassium: 4 mmol/L (ref 3.5–5.1)
Sodium: 135 mmol/L (ref 135–145)

## 2021-10-15 LAB — CBC
HCT: 26.8 % — ABNORMAL LOW (ref 39.0–52.0)
Hemoglobin: 9.5 g/dL — ABNORMAL LOW (ref 13.0–17.0)
MCH: 30.6 pg (ref 26.0–34.0)
MCHC: 35.4 g/dL (ref 30.0–36.0)
MCV: 86.5 fL (ref 80.0–100.0)
Platelets: 188 10*3/uL (ref 150–400)
RBC: 3.1 MIL/uL — ABNORMAL LOW (ref 4.22–5.81)
RDW: 15.8 % — ABNORMAL HIGH (ref 11.5–15.5)
WBC: 13.7 10*3/uL — ABNORMAL HIGH (ref 4.0–10.5)
nRBC: 0 % (ref 0.0–0.2)

## 2021-10-15 LAB — CULTURE, RESPIRATORY W GRAM STAIN: Gram Stain: NONE SEEN

## 2021-10-15 LAB — BLOOD GAS, ARTERIAL
Acid-base deficit: 6.5 mmol/L — ABNORMAL HIGH (ref 0.0–2.0)
Bicarbonate: 18.1 mmol/L — ABNORMAL LOW (ref 20.0–28.0)
O2 Saturation: 99.5 %
Patient temperature: 37
pCO2 arterial: 32 mmHg (ref 32–48)
pH, Arterial: 7.36 (ref 7.35–7.45)
pO2, Arterial: 96 mmHg (ref 83–108)

## 2021-10-15 LAB — HEMOGLOBIN AND HEMATOCRIT, BLOOD
HCT: 27.8 % — ABNORMAL LOW (ref 39.0–52.0)
HCT: 27.5 % — ABNORMAL LOW (ref 39.0–52.0)
Hemoglobin: 9.7 g/dL — ABNORMAL LOW (ref 13.0–17.0)
Hemoglobin: 9.7 g/dL — ABNORMAL LOW (ref 13.0–17.0)

## 2021-10-15 LAB — GLUCOSE, CAPILLARY
Glucose-Capillary: 95 mg/dL (ref 70–99)
Glucose-Capillary: 84 mg/dL (ref 70–99)
Glucose-Capillary: 87 mg/dL (ref 70–99)
Glucose-Capillary: 84 mg/dL (ref 70–99)
Glucose-Capillary: 141 mg/dL — ABNORMAL HIGH (ref 70–99)
Glucose-Capillary: 129 mg/dL — ABNORMAL HIGH (ref 70–99)
Glucose-Capillary: 102 mg/dL — ABNORMAL HIGH (ref 70–99)

## 2021-10-15 LAB — CULTURE, BLOOD (ROUTINE X 2)
Culture: NO GROWTH
Culture: NO GROWTH
Special Requests: ADEQUATE

## 2021-10-15 LAB — PROTIME-INR
INR: 1.8 — ABNORMAL HIGH (ref 0.8–1.2)
Prothrombin Time: 21 seconds — ABNORMAL HIGH (ref 11.4–15.2)

## 2021-10-15 LAB — CBC WITH DIFFERENTIAL/PLATELET
Abs Immature Granulocytes: 0.11 10*3/uL — ABNORMAL HIGH (ref 0.00–0.07)
Basophils Absolute: 0 10*3/uL (ref 0.0–0.1)
Basophils Relative: 0 %
Eosinophils Absolute: 0 10*3/uL (ref 0.0–0.5)
Eosinophils Relative: 0 %
HCT: 25.7 % — ABNORMAL LOW (ref 39.0–52.0)
Hemoglobin: 8.6 g/dL — ABNORMAL LOW (ref 13.0–17.0)
Immature Granulocytes: 1 %
Lymphocytes Relative: 3 %
Lymphs Abs: 0.3 10*3/uL — ABNORMAL LOW (ref 0.7–4.0)
MCH: 29.6 pg (ref 26.0–34.0)
MCHC: 33.5 g/dL (ref 30.0–36.0)
MCV: 88.3 fL (ref 80.0–100.0)
Monocytes Absolute: 0.3 10*3/uL (ref 0.1–1.0)
Monocytes Relative: 2 %
Neutro Abs: 13.1 10*3/uL — ABNORMAL HIGH (ref 1.7–7.7)
Neutrophils Relative %: 94 %
Platelets: 174 10*3/uL (ref 150–400)
RBC: 2.91 MIL/uL — ABNORMAL LOW (ref 4.22–5.81)
RDW: 15.9 % — ABNORMAL HIGH (ref 11.5–15.5)
WBC: 13.9 10*3/uL — ABNORMAL HIGH (ref 4.0–10.5)
nRBC: 0 % (ref 0.0–0.2)

## 2021-10-15 LAB — PHOSPHORUS: Phosphorus: 2.9 mg/dL (ref 2.5–4.6)

## 2021-10-15 LAB — MAGNESIUM: Magnesium: 1.9 mg/dL (ref 1.7–2.4)

## 2021-10-15 LAB — APTT: aPTT: 45 seconds — ABNORMAL HIGH (ref 24–36)

## 2021-10-15 LAB — ABO/RH: ABO/RH(D): O POS

## 2021-10-15 MED ORDER — QUETIAPINE FUMARATE 50 MG PO TABS: 50.0000 mg | ORAL_TABLET | Freq: Two times a day (BID) | ORAL | Status: DC

## 2021-10-15 MED ORDER — MIDAZOLAM HCL 2 MG/2ML IJ SOLN
1.0000 mg | INTRAMUSCULAR | Status: DC | PRN
  Filled 2021-10-15 (×2): qty 2

## 2021-10-15 MED ORDER — PANTOPRAZOLE SODIUM 40 MG IV SOLR
40.0000 mg | Freq: Two times a day (BID) | INTRAVENOUS | Status: DC
  Administered 2021-10-15 – 2021-10-23 (×18): 40 mg via INTRAVENOUS
  Filled 2021-10-15 (×19): qty 10

## 2021-10-15 MED ORDER — FENTANYL BOLUS VIA INFUSION
25.0000 ug | INTRAVENOUS | Status: DC | PRN
  Administered 2021-10-15 – 2021-10-16 (×3): 50 ug via INTRAVENOUS
  Filled 2021-10-15: qty 50

## 2021-10-15 MED ORDER — FENTANYL CITRATE (PF) 100 MCG/2ML IJ SOLN
25.0000 ug | INTRAMUSCULAR | Status: DC | PRN
  Administered 2021-10-15 (×2): 100 ug via INTRAVENOUS

## 2021-10-15 MED ORDER — DEXTROSE IN LACTATED RINGERS 5 % IV SOLN: INTRAVENOUS | Status: DC

## 2021-10-15 NOTE — Progress Notes (Signed)
NAME:  Brandon Bond, MRN:  [id number], DOB:  November 16, 1960, LOS: 5 ADMISSION DATE:  09/28/2021, CONSULTATION DATE:  10/12/2021 REFERRING MD:  Dr. Aileen Fass, CHIEF COMPLAINT:  Acute respiratory distress    History of Present Illness:  Brandon Bond is a 61 year old male with no reported past medical history who presented to the emergency department due to altered mental status that began 3 days prior to admission.  Per report EMS found patient soiled in stool and urine in her recliner.  He was seen tachycardic and hypoxic with saturation 70% on room air.  On ED arrival patient met sepsis criteria with acute organ dysfunction.  He was also found to be COVID-positive.  Head CT negative.  CT abdomen pelvis revealed bilateral lower lobe pneumonia.  By morning of 2/23 patient was seen with progressive hypoxia and acidosis despite max BiPAP settings resulting in PCCM consult  Pertinent  Medical History  None  Significant Hospital Events: Including procedures, antibiotic start and stop dates in addition to other pertinent events   2/20 admitted with altered mental status found to be COVID-positive placed on BiPAP 2/23 failed BiPAP resulting in PCCM consult and intubation 2/24 PEEP dropped to 5  Interim History / Subjective:   Several events overnight Weaned well on pressure support all day, but not extubated due to poor mental status in spite of fentanyl being stopped. Good urine output with Lasix. Overnight became severely agitated, asynchronous requiring restarting of fentanyl drip. Bright red blood rectum noted with drop in hemoglobin  Afebrile, back on low-dose Levophed     Objective   Blood pressure 105/70, pulse (!) 54, temperature (!) 97.2 F (36.2 C), resp. rate 18, height _0  (1.803 m), weight 64.3 kg, SpO2 98 %.    Vent Mode: PRVC FiO2 (%):  [40 %-70 %] 50 % Set Rate:  [18 bmp] 18 bmp Vt Set:  [600 mL] 600 mL PEEP:  [5 cmH20] 5 cmH20 Pressure Support:  [5 cmH20-10 cmH20] 10  cmH20 Plateau Pressure:  [20 cmH20-30 cmH20] 21 cmH20   Intake/Output Summary (Last 24 hours) at 10/15/2021 0944 Last data filed at 10/15/2021 0930 Gross per 24 hour  Intake 2427.97 ml  Output 2251 ml  Net 176.97 ml    Filed Weights   09/22/2021 1836 10/11/21 0500 10/15/21 0500  Weight: 63.5 kg 54.8 kg 64.3 kg    Examination: General: Acute on chronic ill-appearing middle-aged male lying in bed , sedated HEENT: [place]/AT, MM pink/moist, PERRL,  Neuro: RASS -2 on fentanyl drip CV: s1s2 regular rate and rhythm, no murmur, rubs, or gallops,  PULM: Synchronous with vent, bilateral ventilated breath sounds, minimal secretions  GI: soft, bowel sounds active in all 4 quadrants, non-tender, non-distended Extremities: warm/dry, no edema  Skin: no rashes or lesions  Chest x-ray 2/26 independently reviewed shows unchanged bibasilar pneumonia with small effusions  Labs show stable leukocytosis, left shift, normal electrolytes, rising BUN and creatinine    Resolved Hospital Problem list    Elevated LFTs   Assessment & Plan:  Septic shock secondary to pneumonia -Respiratory culture shows Pseudomonas -urine strep antigen negative Legionella negative P: Continue IV cefepime, await sens Levophed drip for MAP goal greater than 65   Acute Hypoxic and hypercapnic Respiratory Failure -Secondary to Pseudomonas pneumonia COVID-related ARDS COPD, 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 Head of bed elevated 30 degrees. Plateau pressures less than 30 cm H20,  SAT/SBT as tolerated,  remain hopeful to extubate him to high flow nasal cannula -mental status limiting, either is too agitated or too sedate Ensure adequate pulmonary hygiene  VAP bundle in place  DuoNebs  Covid pneumonia -Remdesivir -Dexamethasone for 10 days -Not a candidate for baricitinib due to superimposed pneumonia  Lower GI  bleed Hemoglobin has drifted down from 15 on admit to 8.6 at lowest -Continue Protonix every 12. -Bleeding scan, GI consult, monitor H&H every 6 hours, transfuse for hemoglobin less than 7  Acute metabolic encephalopathy -Likely multifactorial including sepsis and hypoxia P: Maintain neuro protective measures Aspirations precautions  PAD protocol -using fentanyl drip, goal RASS 0 to -1, did not tolerate Precedex due to bradycardia -Add Seroquel, hopefully this will help [place] minimize fentanyl drip Delirium precautions  Acute renal failure -Creatinine on admission 3.54, creatinine improved to 1.5 and now worsening again.   -Good response to Lasix 2/25 but BUN rising P: Follow renal function  Monitor urine output Trend Bmet Avoid nephrotoxins Change IV fluids to D5LR    Protein calorie malnutrition -Continue tube feeds  Multiple pressure ulcers present PTA including sacral, ischial tuberosity, vertebral column P: wound care consult,?  Need for surgical debridement Optimize nutrition   Best Practice (right click and "Reselect all SmartList Selections" daily)   Diet/type: tubefeeds DVT prophylaxis: prophylactic heparin  GI prophylaxis: PPI Lines: N/A Foley:  N/A Code Status:  full code Last date of multidisciplinary goals of care discussion: Domestic partner Caren Griffins updated daily  Labs   CBC: Recent Labs  Lab 09/21/2021 1827 10/10/21 0410 10/11/21 0250 10/12/21 0237 10/13/21 0231 10/14/21 0338 10/15/21 0232 10/15/21 0615 10/15/21 0900  WBC 25.5* 21.0*   < > 30.0* 22.8* 22.4* 13.7* 13.9*  --   NEUTROABS 22.7* 19.1*  --   --   --   --   --  13.1*  --   HGB 18.6* 15.0   < > 15.4 11.9* 10.2* 9.5* 8.6* 9.7*  HCT 56.1* 44.8   < > 47.4 36.4* 30.4* 26.8* 25.7* 27.8*  MCV 90.2 90.3   < > 93.1 91.2 88.9 86.5 88.3  --   PLT 623* 391   < > 417* 222 220 188 174  --    < > = values in this interval not displayed.     Basic Metabolic Panel: Recent Labs  Lab  10/11/21 0844 10/12/21 0237 10/12/21 1700 10/13/21 0231 10/14/21 0338 10/15/21 0232  NA 143 145  --  137 131* 135  K 3.0* 4.7  --  4.3 4.0 4.0  CL 113* 115*  --  108 105 109  CO2 20* 22  --  21* 17* 17*  GLUCOSE 148* 164*  --  245* 188* 76  BUN 97* 77*  --  94* 118* 139*  CREATININE 1.55* 1.49*  --  2.10* 2.33* 2.42*  CALCIUM 8.5* 8.8*  --  8.1* 7.9* 7.8*  MG  --  2.9* 2.5* 2.3 2.1 1.9  PHOS  --  6.2* 4.4 3.4 3.0 2.9    GFR: Estimated Creatinine Clearance: 29.2 mL/min (A) (by C-G formula based on SCr of 2.42 mg/dL (H)). Recent Labs  Lab 09/26/2021 1826 10/17/2021 1827 10/06/2021 2011 10/05/2021 2230 09/28/2021 2330 10/10/21 0410 10/10/21 1900 10/11/21 0250 10/13/21 0231 10/14/21 0338 10/15/21 0232 10/15/21 0615  PROCALCITON  --   --   --   --   --   --  2.43  --   --   --   --   --   WBC  --    < >  --   --   --    < >  --    < >  22.8* 22.4* 13.7* 13.9*  LATICACIDVEN 7.7*  --  6.4* 3.5* 2.5*  --   --   --   --   --   --   --    < > = values in this interval not displayed.     Liver Function Tests: Recent Labs  Lab 10/10/2021 1827 10/10/21 0410 10/11/21 0844 10/14/21 0338  AST 213* <5* 70* 25  ALT 89* 139* 97* 27  ALKPHOS 86 63 58 56  BILITOT 1.7* 1.5* 1.0 0.8  PROT 7.8 5.6* 5.6* 4.2*  ALBUMIN 3.3* 2.5* 2.2* 1.9*    No results for input(s): LIPASE, AMYLASE in the last 168 hours. No results for input(s): AMMONIA in the last 168 hours.  ABG    Component Value Date/Time   PHART 7.36 10/15/2021 0630   PCO2ART 32 10/15/2021 0630   PO2ART 96 10/15/2021 0630   HCO3 18.1 (L) 10/15/2021 0630   ACIDBASEDEF 6.5 (H) 10/15/2021 0630   O2SAT 99.5 10/15/2021 0630      Coagulation Profile: Recent Labs  Lab 09/29/2021 1827 10/15/21 0258  INR 1.3* 1.8*     Cardiac Enzymes: Recent Labs  Lab 10/11/21 0844  CKTOTAL 103     HbA1C: No results found for: HGBA1C  CBG: Recent Labs  Lab 10/14/21 2335 10/15/21 0134 10/15/21 0308 10/15/21 0547 10/15/21 0830   GLUCAP 110* 95 84 87 84     Critical care time:   CRITICAL CARE Performed by: Leanna Sato. Chalisa Kobler  Total critical care time: 42 minutes  Critical care time was exclusive of separately billable procedures and treating other patients.  Critical care was necessary to treat or prevent imminent or life-threatening deterioration.  Critical care was time spent personally by me on the following activities: development of treatment plan with patient and/or surrogate as well as nursing, discussions with consultants, evaluation of patient's response to treatment, examination of patient, obtaining history from patient or surrogate, ordering and performing treatments and interventions, ordering and review of laboratory studies, ordering and review of radiographic studies, pulse oximetry and re-evaluation of patient's condition.    Kara Mead MD. Shade Flood. [facility] Pager : [phone]  If no response to pager , please call [phone] until 7 pm After 7:00 pm call Elink  [phone]    10/15/2021, 9:44 AM

## 2021-10-15 NOTE — Progress Notes (Signed)
Patient has been agitated intermittently for majority of the night, this has increased drastically since start of shift. I have annotated in flowsheets these periods of agitation because they have been causing bradycardia (low 40's), tachycardia (130's), and extreme noncompliance w/ ventilator-tachypnea as high as 36. Patient also bites down on the tube which has caused his oxygen levels to drop into the 70's frequently. PRN fentanyl given as prescribed was ineffective for more than an hour at a time. Fentanyl drip re-added, and ceiling increased due to continued noncompliance on ventilator and pain rated 8-9/10 on CPOT scale.   During patient care I discovered that patient had passed a large amount of blood, with large clots from his rectum. E-Link notified and appropriate labs drawn. Emergent blood consent placed in chart, type and screen completed, and blood bank band placed on patient.

## 2021-10-15 NOTE — Progress Notes (Signed)
RT was called in the room due to pt kept desaturating 87-88. Took pt off the vent, bagged and lavaged, suctioned small to medium amount of secretions yellow/tan. Pt placed back on the vent with full support. Increased FIO2 from 40% to 70%. Pt is maintaining his O2 saturation in the 97-98 % at this time. ABG taken and send down to lab for analysis.

## 2021-10-15 NOTE — Progress Notes (Signed)
PT Cancellation Note  Patient Details Name: Brandon Bond MRN: [id number] DOB: [birth year]   Cancelled Treatment:    Reason Eval/Treat Not Completed: Medical issues which prohibited therapy (Attempted extubation Saturday 2/25 and unsuccessful, also noted possible bleed, patient had passed a large amount of blood, with large clots from his rectum. Pt remains medically unstable for mobility, will sign off/hold at this time.) MD please re-consult when pt is medically ready for therapy.    Verner Mould, DPT Acute Rehabilitation Services Office [phone] Pager [phone]

## 2021-10-15 NOTE — Consult Note (Signed)
Referring Provider:  Dr. Elsworth Soho Primary Care Physician:  Lawerance Cruel, MD Primary Gastroenterologist: Althia Forts  Reason for Consultation: GI bleed  HPI: Marek Nghiem is a 61 y.o. male with no significant past medical history admitted to the hospital on October 10, 2021 with altered mental status and weakness.  Was found to have positive for COVID.  CT chest abdomen pelvis showed bilateral lower lobe pneumonia.  Was also found to have hypernatremia and acute kidney injury with creatinine of 3.54 on presentation.  Patient subsequently developed acute hypoxic respiratory failure requiring intubation on October 12, 2021.  There was plan for extubation yesterday but patient was found to be agitated last night and this morning.  Also found to have change in heart rate from low 40s to 130s.  Had episode of rectal bleeding with what he described as blood clots passing from rectum this morning.  GI is consulted for further evaluation.  Patient's hemoglobin on admission was 15.  Hemoglobin was 11.9 two  days ago, 10.2 yesterday and 9.5 this morning.  Patient also had looks like hemoconcentration with elevation of white count of 30,000 and elevation of platelet count at 417 three days ago.  His white counts are 13.7 today and platelet counts are 188.  Patient seen and examined at bedside.  Friend and RN at bedside.  Patient having maroon-colored stool per rectum.  He remains intubated.  History reviewed. No pertinent past medical history.  History reviewed. No pertinent surgical history.  Prior to Admission medications   Not on File    Scheduled Meds:  chlorhexidine gluconate (MEDLINE KIT)  15 mL Mouth Rinse BID   Chlorhexidine Gluconate Cloth  6 each Topical Q0600   collagenase   Topical Daily   dexamethasone (DECADRON) injection  6 mg Intravenous Q24H   docusate sodium  100 mg Oral BID   mouth rinse  15 mL Mouth Rinse 10 times per day   nutrition supplement (JUVEN)  1 packet Oral BID BM    pantoprazole (PROTONIX) IV  40 mg Intravenous Q12H   permethrin   Topical Once   polyethylene glycol  17 g Oral Daily   sodium chloride flush  10-40 mL Intracatheter Q12H   Continuous Infusions:  sodium chloride 250 mL (10/14/21 1257)   ceFEPime (MAXIPIME) IV Stopped (10/15/21 0700)   dextrose 5% lactated ringers 75 mL/hr at 10/15/21 0930   fentaNYL infusion INTRAVENOUS 150 mcg/hr (10/15/21 0930)   norepinephrine (LEVOPHED) Adult infusion 2 mcg/min (10/15/21 0930)   PRN Meds:.acetaminophen, fentaNYL (SUBLIMAZE) injection, ondansetron (ZOFRAN) IV, sodium chloride flush  Allergies as of 10/13/2021   (Not on File)    Family History  Family history unknown: Yes    Social History   Socioeconomic History   Marital status: Single    Spouse name: Not on file   Number of children: Not on file   Years of education: Not on file   Highest education level: Not on file  Occupational History   Not on file  Tobacco Use   Smoking status: Unknown   Smokeless tobacco: Not on file  Substance and Sexual Activity   Alcohol use: Not on file   Drug use: Not on file   Sexual activity: Not on file  Other Topics Concern   Not on file  Social History Narrative   Not on file   Social Determinants of Health   Financial Resource Strain: Not on file  Food Insecurity: Not on file  Transportation Needs: Not on file  Physical  Activity: Not on file  Stress: Not on file  Social Connections: Not on file  Intimate Partner Violence: Not on file    Review of Systems: Not able to obtain, he remains intubated  Physical Exam: Vital signs: Vitals:   10/15/21 0800 10/15/21 0900  BP: 106/72 105/70  Pulse: (!) 54   Resp: 18 18  Temp: (!) 97.5 F (36.4 C) (!) 97.2 F (36.2 C)  SpO2: 98%    Last BM Date : 10/15/21 General:   Debated, sedated Lungs: Good air entry bilaterally Abdomen : Nontender, mildly distended, bowel sounds present, no peritoneal signs Heart:  Regular rate and rhythm; no  murmurs, clicks, rubs,  or gallops. Rectal:  Deferred  GI:  Lab Results: Recent Labs    10/14/21 0338 10/15/21 0232 10/15/21 0615 10/15/21 0900  WBC 22.4* 13.7* 13.9*  --   HGB 10.2* 9.5* 8.6* 9.7*  HCT 30.4* 26.8* 25.7* 27.8*  PLT 220 188 174  --    BMET Recent Labs    10/13/21 0231 10/14/21 0338 10/15/21 0232  NA 137 131* 135  K 4.3 4.0 4.0  CL 108 105 109  CO2 21* 17* 17*  GLUCOSE 245* 188* 76  BUN 94* 118* 139*  CREATININE 2.10* 2.33* 2.42*  CALCIUM 8.1* 7.9* 7.8*   LFT Recent Labs    10/14/21 0338  PROT 4.2*  ALBUMIN 1.9*  AST 25  ALT 27  ALKPHOS 56  BILITOT 0.8   PT/INR Recent Labs    10/15/21 0258  LABPROT 21.0*  INR 1.8*     Studies/Results: DG Chest Port 1 View  Result Date: 10/15/2021 CLINICAL DATA:  Respiratory failure. EXAM: PORTABLE CHEST 1 VIEW COMPARISON:  10/14/2021 at 6:05 a.m. FINDINGS: 5:56 a.m., 10/15/2021. ETT tip is 6 cm from the carina. NGT tip is at the mid body of stomach. Right PICC tip about the superior cavoatrial junction The heart is enlarged. Mild central vascular congestion is again seen with slight central interstitial edema. Bilateral lower zonal airspace disease appears similar as well as small underlying pleural effusions. The remaining lungs are clear.  Overall aeration is unchanged. IMPRESSION: 1. Stable bilateral lower zonal airspace disease, densest medially, with small pleural effusions. 2. Mild perihilar vascular congestion with slight central edema also unchanged. 3. Stable support devices. Electronically Signed   By: Telford Nab M.D.   On: 10/15/2021 06:11   DG Chest Port 1 View  Result Date: 10/14/2021 CLINICAL DATA:  Follow-up pneumonia. EXAM: PORTABLE CHEST 1 VIEW COMPARISON:  10/12/2021 and older studies. FINDINGS: Bilateral lung base airspace opacities are without significant change from the most recent prior study. No new lung abnormalities. No visualized pleural effusion and no pneumothorax. Endotracheal  tube tip projects 4 cm above the carina. Right PICC has its tip at the caval atrial junction. Nasal/orogastric tube passes well below the diaphragm. IMPRESSION: 1. Since the most recent prior exam a right-sided PICC and nasal/orogastric tube have been placed, both well-positioned. 2. No other change. Persistent bilateral lung base airspace opacities consistent with multifocal pneumonia. Electronically Signed   By: Lajean Manes M.D.   On: 10/14/2021 08:11   ECHOCARDIOGRAM LIMITED  Result Date: 10/14/2021    ECHOCARDIOGRAM LIMITED REPORT   Patient Name:   AXZEL ROCKHILL Date of Exam: 10/14/2021 Medical Rec #:  [id number]    Height:       71.0 in Accession #:    [id number]   Weight:       120.8 lb Date of Birth:  07-04-1961  BSA:          1.702 m Patient Age:    66 years     BP:           116/61 mmHg Patient Gender: M            HR:           56 bpm. Exam Location:  Inpatient Procedure: Limited Echo, Color Doppler and Cardiac Doppler Indications:    R06.9 DOE  History:        Patient has no prior history of Echocardiogram examinations.                 Risk Factors:COVID+.  Sonographer:    Raquel Sarna Senior RDCS Referring Phys: Caney D HARRIS  Sonographer Comments: Technically difficult study, no usable apical window due to thin body habitus. IMPRESSIONS  1. Left ventricular ejection fraction, by estimation, is 55 to 60%. The left ventricle has normal function. The left ventricle has no regional wall motion abnormalities. There is mild left ventricular hypertrophy of the Posterior segment. Left ventricular diastolic function could not be evaluated.  2. Right ventricular systolic function is normal. The right ventricular size is normal. There is moderately elevated pulmonary artery systolic pressure. The estimated right ventricular systolic pressure is 00.3 mmHg.  3. The mitral valve is normal in structure. Trivial mitral valve regurgitation. No evidence of mitral stenosis.  4. The aortic valve is tricuspid. Aortic  valve regurgitation is trivial. Aortic valve sclerosis/calcification is present, without any evidence of aortic stenosis.  5. The inferior vena cava is normal in size with greater than 50% respiratory variability, suggesting right atrial pressure of 3 mmHg. FINDINGS  Left Ventricle: Left ventricular ejection fraction, by estimation, is 55 to 60%. The left ventricle has normal function. The left ventricle has no regional wall motion abnormalities. The left ventricular internal cavity size was normal in size. There is  mild left ventricular hypertrophy of the Posterior segment. Abnormal (paradoxical) septal motion, consistent with left bundle branch block. Left ventricular diastolic function could not be evaluated. Right Ventricle: The right ventricular size is normal. No increase in right ventricular wall thickness. Right ventricular systolic function is normal. There is moderately elevated pulmonary artery systolic pressure. The tricuspid regurgitant velocity is 3.12 m/s, and with an assumed right atrial pressure of 15 mmHg, the estimated right ventricular systolic pressure is 70.4 mmHg. Left Atrium: Left atrial size was normal in size. Right Atrium: Right atrial size was normal in size. Pericardium: There is no evidence of pericardial effusion. Mitral Valve: The mitral valve is normal in structure. Trivial mitral valve regurgitation. No evidence of mitral valve stenosis. Tricuspid Valve: The tricuspid valve is normal in structure. Tricuspid valve regurgitation is mild . No evidence of tricuspid stenosis. Aortic Valve: The aortic valve is tricuspid. Aortic valve regurgitation is trivial. Aortic valve sclerosis/calcification is present, without any evidence of aortic stenosis. Pulmonic Valve: The pulmonic valve was normal in structure. Pulmonic valve regurgitation is not visualized. No evidence of pulmonic stenosis. Aorta: The aortic root is normal in size and structure. Venous: The inferior vena cava is normal in  size with greater than 50% respiratory variability, suggesting right atrial pressure of 3 mmHg. IAS/Shunts: The interatrial septum appears to be lipomatous. No atrial level shunt detected by color flow Doppler. LEFT VENTRICLE PLAX 2D LVIDd:         3.90 cm LVIDs:         2.00 cm LV PW:  1.30 cm LV IVS:        1.00 cm LVOT diam:     2.20 cm LVOT Area:     3.80 cm  LEFT ATRIUM         Index LA diam:    3.50 cm 2.06 cm/m   AORTA Ao Root diam: 3.60 cm TRICUSPID VALVE TR Peak grad:   38.9 mmHg TR Vmax:        312.00 cm/s  SHUNTS Systemic Diam: 2.20 cm Fransico Him MD Electronically signed by Fransico Him MD Signature Date/Time: 10/14/2021/11:36:17 AM    Final     Impression/Plan: -GI bleed in a patient with multiple comorbidities including COVID pneumonia and respiratory failure currently intubated. CT Abdomen pelvis without contrast on admission showed no acute GI changes. -Acute blood loss anemia.  There may be some component of dilution. -Septic shock -Acute metabolic encephalopathy -Acute kidney injury  Recommendation ------------------------- -Case discussed with ICU team, GI bleeding scan has been ordered.  Not able to get CT angio because of acute kidney injury. -Recommend to continue Protonix IV twice daily for now -Monitor H&H.  Transfuse if hemoglobin less than 8. -GI will follow   LOS: 5 days   Otis Brace  MD, FACP 10/15/2021, 9:40 AM  Contact #  [phone]

## 2021-10-16 ENCOUNTER — Encounter (HOSPITAL_COMMUNITY): Payer: Self-pay | Admitting: Internal Medicine

## 2021-10-16 ENCOUNTER — Inpatient Hospital Stay (HOSPITAL_COMMUNITY): Payer: 59

## 2021-10-16 ENCOUNTER — Inpatient Hospital Stay (HOSPITAL_COMMUNITY): Payer: 59 | Admitting: Registered Nurse

## 2021-10-16 DIAGNOSIS — J151 Pneumonia due to Pseudomonas: Secondary | ICD-10-CM

## 2021-10-16 DIAGNOSIS — J9601 Acute respiratory failure with hypoxia: Secondary | ICD-10-CM | POA: Diagnosis not present

## 2021-10-16 DIAGNOSIS — J1282 Pneumonia due to coronavirus disease 2019: Secondary | ICD-10-CM

## 2021-10-16 DIAGNOSIS — N179 Acute kidney failure, unspecified: Secondary | ICD-10-CM | POA: Diagnosis not present

## 2021-10-16 DIAGNOSIS — U071 COVID-19: Secondary | ICD-10-CM

## 2021-10-16 LAB — CBC
HCT: 26.4 % — ABNORMAL LOW (ref 39.0–52.0)
HCT: 30.2 % — ABNORMAL LOW (ref 39.0–52.0)
Hemoglobin: 9.4 g/dL — ABNORMAL LOW (ref 13.0–17.0)
Hemoglobin: 10.6 g/dL — ABNORMAL LOW (ref 13.0–17.0)
MCH: 29.8 pg (ref 26.0–34.0)
MCH: 30.1 pg (ref 26.0–34.0)
MCHC: 35.6 g/dL (ref 30.0–36.0)
MCHC: 35.1 g/dL (ref 30.0–36.0)
MCV: 83.8 fL (ref 80.0–100.0)
MCV: 85.8 fL (ref 80.0–100.0)
Platelets: 221 10*3/uL (ref 150–400)
Platelets: 294 10*3/uL (ref 150–400)
RBC: 3.15 MIL/uL — ABNORMAL LOW (ref 4.22–5.81)
RBC: 3.52 MIL/uL — ABNORMAL LOW (ref 4.22–5.81)
RDW: 15.7 % — ABNORMAL HIGH (ref 11.5–15.5)
RDW: 16.2 % — ABNORMAL HIGH (ref 11.5–15.5)
WBC: 20.3 10*3/uL — ABNORMAL HIGH (ref 4.0–10.5)
WBC: 36.7 10*3/uL — ABNORMAL HIGH (ref 4.0–10.5)
nRBC: 0 % (ref 0.0–0.2)
nRBC: 0.1 % (ref 0.0–0.2)

## 2021-10-16 LAB — GLUCOSE, CAPILLARY
Glucose-Capillary: 122 mg/dL — ABNORMAL HIGH (ref 70–99)
Glucose-Capillary: 119 mg/dL — ABNORMAL HIGH (ref 70–99)
Glucose-Capillary: 121 mg/dL — ABNORMAL HIGH (ref 70–99)
Glucose-Capillary: 112 mg/dL — ABNORMAL HIGH (ref 70–99)

## 2021-10-16 LAB — BLOOD GAS, ARTERIAL
Acid-base deficit: 8.9 mmol/L — ABNORMAL HIGH (ref 0.0–2.0)
Bicarbonate: 19.8 mmol/L — ABNORMAL LOW (ref 20.0–28.0)
Expiratory PAP: 5 cmH2O
FIO2: 60 %
Inspiratory PAP: 15 cmH2O
Mode: POSITIVE
O2 Saturation: 96.1 %
Patient temperature: 36.5
pCO2 arterial: 52 mmHg — ABNORMAL HIGH (ref 32–48)
pH, Arterial: 7.19 — CL (ref 7.35–7.45)
pO2, Arterial: 80 mmHg — ABNORMAL LOW (ref 83–108)

## 2021-10-16 LAB — BASIC METABOLIC PANEL
Anion gap: 8 (ref 5–15)
Anion gap: 10 (ref 5–15)
BUN: 160 mg/dL — ABNORMAL HIGH (ref 8–23)
BUN: 173 mg/dL — ABNORMAL HIGH (ref 8–23)
CO2: 19 mmol/L — ABNORMAL LOW (ref 22–32)
CO2: 21 mmol/L — ABNORMAL LOW (ref 22–32)
Calcium: 8.4 mg/dL — ABNORMAL LOW (ref 8.9–10.3)
Calcium: 8.3 mg/dL — ABNORMAL LOW (ref 8.9–10.3)
Chloride: 112 mmol/L — ABNORMAL HIGH (ref 98–111)
Chloride: 111 mmol/L (ref 98–111)
Creatinine, Ser: 3.51 mg/dL — ABNORMAL HIGH (ref 0.61–1.24)
Creatinine, Ser: 3.42 mg/dL — ABNORMAL HIGH (ref 0.61–1.24)
GFR, Estimated: 19 mL/min — ABNORMAL LOW (ref >60)
GFR, Estimated: 20 mL/min — ABNORMAL LOW (ref >60)
Glucose, Bld: 120 mg/dL — ABNORMAL HIGH (ref 70–99)
Glucose, Bld: 140 mg/dL — ABNORMAL HIGH (ref 70–99)
Potassium: 4.9 mmol/L (ref 3.5–5.1)
Potassium: 4.5 mmol/L (ref 3.5–5.1)
Sodium: 139 mmol/L (ref 135–145)
Sodium: 142 mmol/L (ref 135–145)

## 2021-10-16 LAB — LACTIC ACID, PLASMA: Lactic Acid, Venous: 2.2 mmol/L (ref 0.5–1.9)

## 2021-10-16 LAB — PHOSPHORUS: Phosphorus: 4.5 mg/dL (ref 2.5–4.6)

## 2021-10-16 LAB — MAGNESIUM: Magnesium: 2.2 mg/dL (ref 1.7–2.4)

## 2021-10-16 MED ORDER — DEXTROSE 10 % IV SOLN: INTRAVENOUS | Status: DC

## 2021-10-16 MED ORDER — FUROSEMIDE 10 MG/ML IJ SOLN
INTRAMUSCULAR | Status: AC
  Filled 2021-10-16: qty 4

## 2021-10-16 MED ORDER — LINEZOLID 600 MG/300ML IV SOLN
600.0000 mg | Freq: Once | INTRAVENOUS | Status: AC
  Administered 2021-10-17: 600 mg via INTRAVENOUS
  Filled 2021-10-16: qty 300

## 2021-10-16 MED ORDER — FUROSEMIDE 10 MG/ML IJ SOLN
40.0000 mg | Freq: Once | INTRAMUSCULAR | Status: AC
  Administered 2021-10-16: 40 mg via INTRAVENOUS

## 2021-10-16 MED ORDER — CHLORHEXIDINE GLUCONATE 0.12 % MT SOLN
15.0000 mL | Freq: Two times a day (BID) | OROMUCOSAL | Status: DC
  Administered 2021-10-16 – 2021-10-19 (×5): 15 mL via OROMUCOSAL
  Filled 2021-10-16 (×4): qty 15

## 2021-10-16 MED ORDER — ORAL CARE MOUTH RINSE
15.0000 mL | Freq: Two times a day (BID) | OROMUCOSAL | Status: DC
  Administered 2021-10-17 (×3): 15 mL via OROMUCOSAL

## 2021-10-16 MED ORDER — ALBUMIN HUMAN 5 % IV SOLN
25.0000 g | Freq: Once | INTRAVENOUS | Status: AC
Start: 2021-10-16 — End: 2021-10-17
  Administered 2021-10-17: 25 g via INTRAVENOUS
  Filled 2021-10-16: qty 500

## 2021-10-16 MED ORDER — LACTATED RINGERS IV BOLUS
1000.0000 mL | Freq: Once | INTRAVENOUS | Status: AC
  Administered 2021-10-16 (×2): 1000 mL via INTRAVENOUS

## 2021-10-16 MED ORDER — ETOMIDATE 2 MG/ML IV SOLN
INTRAVENOUS | Status: DC | PRN
  Administered 2021-10-16: 16 mg via INTRAVENOUS

## 2021-10-16 MED ORDER — SUCCINYLCHOLINE CHLORIDE 200 MG/10ML IV SOSY
PREFILLED_SYRINGE | INTRAVENOUS | Status: DC | PRN
  Administered 2021-10-16: 14 mg via INTRAVENOUS

## 2021-10-16 MED ORDER — HYDROCORTISONE ACETATE 25 MG RE SUPP
25.0000 mg | Freq: Two times a day (BID) | RECTAL | Status: DC
  Administered 2021-10-16 – 2021-10-19 (×7): 25 mg via RECTAL
  Filled 2021-10-16 (×9): qty 1

## 2021-10-16 MED ORDER — FENTANYL CITRATE (PF) 100 MCG/2ML IJ SOLN
50.0000 ug | INTRAMUSCULAR | Status: DC | PRN
  Administered 2021-10-17 (×2): 50 ug via INTRAVENOUS
  Filled 2021-10-16 (×2): qty 2

## 2021-10-16 NOTE — Progress Notes (Cosign Needed)
RT NTS patient with no complications. Patient had thick white/yellow secretions. Vitals remained stable.

## 2021-10-16 NOTE — TOC Progression Note (Addendum)
Transition of Care [facility]) - Progression Note    Patient Details  Name: Brandon Bond MRN: [id number] Date of Birth: 03/20/1961  Transition of Care [facility]) CM/SW Contact  Leeroy Cha, RN Phone Number: 10/16/2021, 7:34 AM  Clinical Narrative:    REMAINS ON THE VENT AT 40% iv pressors and iv abx.  Following for toc needs. Tct-APS-[place]-report made to Adult Protective services. Tcf-[place]-APS-case has been accepted .  The home will investigated. Expected Discharge Plan: Home/Self Care Barriers to Discharge: Continued Medical Work up  Expected Discharge Plan and Services Expected Discharge Plan: Home/Self Care   Discharge Planning Services: CM Consult   Living arrangements for the past 2 months: Single Family Home                                       Social Determinants of Health (SDOH) Interventions    Readmission Risk Interventions No flowsheet data found.

## 2021-10-16 NOTE — Progress Notes (Signed)
NAME:  Brandon Bond, MRN:  [id number], DOB:  July 03, 1961, LOS: 6 ADMISSION DATE:  09/22/2021, CONSULTATION DATE:  10/12/2021 REFERRING MD:  Dr. Aileen Fass, CHIEF COMPLAINT:  Acute respiratory distress    History of Present Illness:  Brandon Bond is a 61 year old male with no reported past medical history who presented to the emergency department due to altered mental status that began 3 days prior to admission.  Per report EMS found patient soiled in stool and urine in her recliner.  He was seen tachycardic and hypoxic with saturation 70% on room air.  On ED arrival patient met sepsis criteria with acute organ dysfunction.  He was also found to be COVID-positive.  Head CT negative.  CT abdomen pelvis revealed bilateral lower lobe pneumonia.  By morning of 2/23 patient was seen with progressive hypoxia and acidosis despite max BiPAP settings resulting in PCCM consult  Pertinent  Medical History  None  Significant Hospital Events: Including procedures, antibiotic start and stop dates in addition to other pertinent events   2/20 admitted with altered mental status found to be COVID-positive placed on BiPAP 2/23 failed BiPAP resulting in PCCM consult and intubation 2/24 PEEP dropped to 5 2/25 Weaned on pressure support all day, but not extubated due to poor mental status  2/26 Bright red blood rectum noted with drop in hemoglobin, bleeding scan negative  Interim History / Subjective:   Critically ill, intubated, on low-dose Levophed. Low-grade febrile 100.4. Continues to have episodes of bright red blood per rectum, per RN Only 600 cc urine output charted    Objective   Blood pressure 131/75, pulse 98, temperature 98.8 F (37.1 C), resp. rate (!) 22, height $RemoveBe'5\' 11"'emkRzZErP$  (1.803 m), weight 69.2 kg, SpO2 97 %.    Vent Mode: PSV;CPAP FiO2 (%):  [40 %-100 %] 40 % Set Rate:  [18 bmp] 18 bmp Vt Set:  [600 mL] 600 mL PEEP:  [5 cmH20] 5 cmH20 Pressure Support:  [5 cmH20] 5 cmH20 Plateau Pressure:   [18 cmH20-21 cmH20] 21 cmH20   Intake/Output Summary (Last 24 hours) at 10/16/2021 6812 Last data filed at 10/16/2021 0900 Gross per 24 hour  Intake 1895.32 ml  Output 800 ml  Net 1095.32 ml    Filed Weights   10/11/21 0500 10/15/21 0500 10/16/21 0500  Weight: 54.8 kg 64.3 kg 69.2 kg    Examination: General: Acute on chronic ill-appearing middle-aged male lying in bed , sedate HEENT: [place]/AT, MM pink/moist, PERRL,  Neuro: RASS -2 on fentanyl drip CV: s1s2 regular rate and rhythm, no murmur, rubs, or gallops,  PULM: Synchronous with vent, no accessory muscle use, clear breath sounds bilateral, no rhonchi  GI: soft, bowel sounds active in all 4 quadrants, non-tender, non-distended Extremities: warm/dry, no edema  Skin: no rashes or lesions  Chest x-ray 2/27 independently reviewed shows improved bibasilar pneumonia  Labs show stable leukocytosis, stable anemia, normal electrolytes, rising BUN and creatinine    Resolved Hospital Problem list    Elevated LFTs   Assessment & Plan:  Septic shock secondary to pneumonia -Respiratory culture shows Pseudomonas, pansensitive -urine strep antigen negative Legionella negative P: Continue IV cefepime, await sens Levophed drip for MAP goal greater than 65   Acute Hypoxic and hypercapnic Respiratory Failure -Secondary to Pseudomonas pneumonia COVID-related ARDS COPD, 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 Head of bed elevated 30 degrees. Plateau pressures less than 30 cm H20,  SAT/SBT as  tolerated, plan is to extubate him to high flow nasal cannula -mental status limiting, either is too agitated or too sedate Ensure adequate pulmonary hygiene  VAP bundle in place  DuoNebs  Covid pneumonia -Remdesivir -Dexamethasone for 10 days -Not a candidate for baricitinib due to superimposed pneumonia  Lower GI bleed, bleeding scan  negative Hemoglobin has drifted down from 15 on admit to 8.6 at lowest -Continue Protonix every 12. -GI following, hemoglobin stable -transfuse for hemoglobin less than 7  Acute metabolic encephalopathy -Likely multifactorial including sepsis and now uremic P: Maintain neuro protective measures Aspirations precautions  PAD protocol -using fentanyl drip, goal RASS 0 to -1, did not tolerate Precedex due to bradycardia, recurrent episodes of bradycardia due to coughing and gagging on vent/vagal response -2/26 added Seroquel, hopefully this will help [place] minimize fentanyl drip Delirium precautions  Acute renal failure -Creatinine on admission 3.54, creatinine improved to 1.5, then worse -Azotemia could also be related to GI bleed and steroids -Good response to Lasix 2/25 but BUN rising P: Follow renal function  Monitor urine output Trend Bmet Avoid nephrotoxins D5LR @ 50 Hopeful to avoid dialysis here    Protein calorie malnutrition -Continue tube feeds  Multiple pressure ulcers present PTA including sacral, ischial tuberosity, vertebral column P: wound care consult,?  Need for surgical debridement Optimize nutrition   Best Practice (right click and "Reselect all SmartList Selections" daily)   Diet/type: tubefeeds DVT prophylaxis: prophylactic heparin  GI prophylaxis: PPI Lines: N/A Foley:  N/A Code Status:  full code Last date of multidisciplinary goals of care discussion: Domestic partner Caren Griffins updated daily  Labs   CBC: Recent Labs  Lab 10/17/2021 1827 10/10/21 0410 10/11/21 0250 10/13/21 0231 10/14/21 0338 10/15/21 0232 10/15/21 0615 10/15/21 0900 10/15/21 2104 10/16/21 0540  WBC 25.5* 21.0*   < > 22.8* 22.4* 13.7* 13.9*  --   --  20.3*  NEUTROABS 22.7* 19.1*  --   --   --   --  13.1*  --   --   --   HGB 18.6* 15.0   < > 11.9* 10.2* 9.5* 8.6* 9.7* 9.7* 9.4*  HCT 56.1* 44.8   < > 36.4* 30.4* 26.8* 25.7* 27.8* 27.5* 26.4*  MCV 90.2 90.3   < > 91.2 88.9  86.5 88.3  --   --  83.8  PLT 623* 391   < > 222 220 188 174  --   --  221   < > = values in this interval not displayed.     Basic Metabolic Panel: Recent Labs  Lab 10/12/21 0237 10/12/21 1700 10/13/21 0231 10/14/21 0338 10/15/21 0232 10/16/21 0540  NA 145  --  137 131* 135 139  K 4.7  --  4.3 4.0 4.0 4.9  CL 115*  --  108 105 109 112*  CO2 22  --  21* 17* 17* 19*  GLUCOSE 164*  --  245* 188* 76 120*  BUN 77*  --  94* 118* 139* 160*  CREATININE 1.49*  --  2.10* 2.33* 2.42* 3.51*  CALCIUM 8.8*  --  8.1* 7.9* 7.8* 8.4*  MG 2.9* 2.5* 2.3 2.1 1.9 2.2  PHOS 6.2* 4.4 3.4 3.0 2.9 4.5    GFR: Estimated Creatinine Clearance: 21.6 mL/min (A) (by C-G formula based on SCr of 3.51 mg/dL (H)). Recent Labs  Lab 10/16/2021 1826 10/02/2021 1827 10/08/2021 2011 09/21/2021 2230 09/20/2021 2330 10/10/21 0410 10/10/21 1900 10/11/21 0250 10/14/21 2122 10/15/21 0232 10/15/21 0615 10/16/21 0540  PROCALCITON  --   --   --   --   --   --  2.43  --   --   --   --   --   WBC  --    < >  --   --   --    < >  --    < > 22.4* 13.7* 13.9* 20.3*  LATICACIDVEN 7.7*  --  6.4* 3.5* 2.5*  --   --   --   --   --   --   --    < > = values in this interval not displayed.     Liver Function Tests: Recent Labs  Lab 10/11/2021 1827 10/10/21 0410 10/11/21 0844 10/14/21 0338  AST 213* <5* 70* 25  ALT 89* 139* 97* 27  ALKPHOS 86 63 58 56  BILITOT 1.7* 1.5* 1.0 0.8  PROT 7.8 5.6* 5.6* 4.2*  ALBUMIN 3.3* 2.5* 2.2* 1.9*    No results for input(s): LIPASE, AMYLASE in the last 168 hours. No results for input(s): AMMONIA in the last 168 hours.  ABG    Component Value Date/Time   PHART 7.36 10/15/2021 0630   PCO2ART 32 10/15/2021 0630   PO2ART 96 10/15/2021 0630   HCO3 18.1 (L) 10/15/2021 0630   ACIDBASEDEF 6.5 (H) 10/15/2021 0630   O2SAT 99.5 10/15/2021 0630      Coagulation Profile: Recent Labs  Lab 10/16/2021 1827 10/15/21 0258  INR 1.3* 1.8*     Cardiac Enzymes: Recent Labs  Lab  10/11/21 0844  CKTOTAL 103     HbA1C: No results found for: HGBA1C  CBG: Recent Labs  Lab 10/15/21 0830 10/15/21 1416 10/15/21 1611 10/15/21 2300 10/16/21 0744  GLUCAP 84 141* 129* 102* 122*     Critical care time:   CRITICAL CARE Performed by: Leanna Sato. Nur Krasinski  Total critical care time: 40 minutes  Critical care time was exclusive of separately billable procedures and treating other patients.  Critical care was necessary to treat or prevent imminent or life-threatening deterioration.  Critical care was time spent personally by me on the following activities: development of treatment plan with patient and/or surrogate as well as nursing, discussions with consultants, evaluation of patient's response to treatment, examination of patient, obtaining history from patient or surrogate, ordering and performing treatments and interventions, ordering and review of laboratory studies, ordering and review of radiographic studies, pulse oximetry and re-evaluation of patient's condition.    Kara Mead MD. Shade Flood. [facility] Pager : [phone]  If no response to pager , please call [phone] until 7 pm After 7:00 pm call Elink  [phone]    10/16/2021, 9:42 AM

## 2021-10-16 NOTE — Progress Notes (Addendum)
61 year old male  presented to the emergency department due to altered mental status that began 3 days prior to admission. EMS found patient soiled in stool . Found to be COVID-positive.  Head CT negative.  CT abdomen pelvis revealed bilateral lower lobe pneumonia.  On 2/23 patient was seen with progressive hypoxia and acidosis despite max BiPAP settings -> intubated. Found to have con-comitant Pseudomonas Pneumonia, currently on Cefepime. Extubated today. E-Link called for change in mental status, poorly responsive   Plans: -ABG/CXR/chem 7 /lactic -suspect aspiration vs progressive uremic encephalopathy/hypercarbic encephalopathy  -BUN/Cr ratio quite high- may benefit from volume recus trial/albumin, will order . Hold further Lasix  -plan for re-intubation  -if ABG not explanatory will get CT brain -WBC up to 20- ? New bug- broaden to include MRSA coverage Zyvox one time dose for now. Check sputum cx/MRSA swab after intubated- if negative can de-escalate per day team

## 2021-10-16 NOTE — Procedures (Cosign Needed)
Extubation Procedure Note  Patient Details:   Name: Thayne Okin DOB: 1961/08/02 MRN: [id number]   Airway Documentation:  Airway 8 mm (Active)  Secured at (cm) 24 cm 10/16/21 1528  Measured From Lips 10/16/21 1528  Secured Location Right 10/16/21 1528  Secured By [employer] 10/16/21 1528  Tube Holder Repositioned Yes 10/16/21 1528  Prone position No 10/16/21 1528  Cuff Pressure (cm H2O) Green OR 18-26 CmH2O 10/16/21 1528  Site Condition Dry 10/16/21 1528   Vent end date: (not recorded) Vent end time: (not recorded)   Evaluation  O2 sats: stable throughout Complications: No apparent complications Patient did tolerate procedure well. Bilateral Breath Sounds: Clear, Diminished   Patient to weak and unable to speak No  Sallye Ober 10/16/2021, 3:51 PM

## 2021-10-16 NOTE — Progress Notes (Signed)
Nash Dimmer from [facility] DSS came by to speak with pt on behalf of [employer]. Informed her that pt is currently intubated and unable to speak, pending extubation. Kaleen Odea will revisit pt tomorrow. Her contact info is listed below: Work cell - [phone] [facility] DSS office - [phone]

## 2021-10-16 NOTE — Anesthesia Postprocedure Evaluation (Signed)
Anesthesia Post Note  Patient: Brandon Bond  Procedure(s) Performed: AN AD [place] INTUBATION     Patient location during evaluation: ICU Anesthesia Type: General Level of consciousness: patient remains intubated per anesthesia plan Respiratory status: patient remains intubated per anesthesia plan Cardiovascular status: blood pressure returned to baseline Anesthetic complications: no Comments: Emergent COVID intubation    No notable events documented.  Last Vitals:  Vitals:   10/16/21 2200 10/16/21 2315  BP: (!) 158/88 134/87  Pulse: 100 (!) 47  Resp: (!) 31 16  Temp: 36.6 C   SpO2: (!) 85% 98%    Last Pain:  Vitals:   10/16/21 2000  TempSrc: Bladder  PainSc:                  Pervis Hocking

## 2021-10-16 NOTE — Progress Notes (Signed)
[facility] Progress Note  Brandon Bond 61 y.o. [birth year]  CC:  GI bleed   Subjective: Patient still intubated resting in hospital bed.  Per RN report, patient had multiple episodes of bright red blood per rectum last night.  ROS : Review of Systems  Unable to perform ROS: Acuity of condition     Objective: Vital signs in last 24 hours: Vitals:   10/16/21 0848 10/16/21 0900  BP: 131/78 131/75  Pulse: 84 98  Resp: 20 (!) 22  Temp:  98.8 F (37.1 C)  SpO2: 97% 97%    Physical Exam:  General:  Elderly, ill-appearing, intubated  Head:  Normocephalic, without obvious abnormality, atraumatic  Eyes:  Anicteric sclera, EOM's intact  Lungs:   Bilateral ventilated breath sounds  Heart:  Regular rate and rhythm, S1, S2 normal  Abdomen:   Soft, non-tender, bowel sounds active all four quadrants,  no masses,     Lab Results: Recent Labs    10/15/21 0232 10/16/21 0540  NA 135 139  K 4.0 4.9  CL 109 112*  CO2 17* 19*  GLUCOSE 76 120*  BUN 139* 160*  CREATININE 2.42* 3.51*  CALCIUM 7.8* 8.4*  MG 1.9 2.2  PHOS 2.9 4.5   Recent Labs    10/14/21 0338  AST 25  ALT 27  ALKPHOS 56  BILITOT 0.8  PROT 4.2*  ALBUMIN 1.9*   Recent Labs    10/15/21 0615 10/15/21 0900 10/15/21 2104 10/16/21 0540  WBC 13.9*  --   --  20.3*  NEUTROABS 13.1*  --   --   --   HGB 8.6*   < > 9.7* 9.4*  HCT 25.7*   < > 27.5* 26.4*  MCV 88.3  --   --  83.8  PLT 174  --   --  221   < > = values in this interval not displayed.   Recent Labs    10/15/21 0258  LABPROT 21.0*  INR 1.8*      Assessment GI bleed, acute blood loss anemia -CT abdomen pelvis without contrast on admission showed no acute GI changes -No previous history of colonoscopy -Hgb 9.4 (9.7 yesterday) -Leukocytosis with WBC 20.3 -Bleeding scan 2/26 negative  COVID-pneumonia and respiratory failure  Septic shock  Acute metabolic encephalopathy  AKI -BUN 160, creatinine 3.51 -GFR 19  Plan: GI  bleed in a patient with multiple comorbidities including COVID-pneumonia and respiratory failure that is currently intubated.  Negative bleeding scan.  Continuing to have bright red blood per rectum.  Patient is not stable enough for any procedures at this time and unable to get CTA due to AKI  continue Protonix IV twice daily for now Continue daily CBC and transfuse as needed to maintain HGB > 8 [facility] will follow  Legrand Como PA-C 10/16/2021, 9:36 AM  Contact #  [phone]

## 2021-10-16 NOTE — Progress Notes (Signed)
RT was called in the room due to pt kept desaturating. Pt placed on bipap to see if that helped. Pt did come on his O2 to 92%, but pt was still not responding. Pt intubated and placed on the previous vent settings per MD verbal orders. Sputum sample collected and send down to the lab for analysis.

## 2021-10-16 NOTE — Progress Notes (Signed)
Reassessed post extubation Weak but effective cough More awake ,follows 1 step commands , power 2-3/5 all 4Es Pupils Rt 16mm Lt 3 mm both RTL   Updated so at bedside Story Vanvranken V. Vassie Loll MD

## 2021-10-16 NOTE — Anesthesia Procedure Notes (Signed)
Procedure Name: Intubation Date/Time: 10/16/2021 10:51 PM Performed by: Lissa Morales, CRNA Pre-anesthesia Checklist: Patient identified, Emergency Drugs available, Suction available and Patient being monitored Patient Re-evaluated:Patient Re-evaluated prior to induction Oxygen Delivery Method: Circle system utilized Preoxygenation: Pre-oxygenation with 100% oxygen Induction Type: IV induction Laryngoscope Size: Glidescope and 3 Grade View: Grade II Tube type: Oral Tube size: 8.0 mm Number of attempts: 1 Airway Equipment and Method: Stylet Placement Confirmation: ETT inserted through vocal cords under direct vision, positive ETCO2, breath sounds checked- equal and bilateral and CO2 detector Secured at: 23 cm Tube secured with: Tape Dental Injury: Teeth and Oropharynx as per pre-operative assessment  Comments: Airway very excoriated and swollen.with increased thick secretions. Able to visualize well after suctioned.bilateral breaht sounds and  positive ETCO2 detector./. Xray taken

## 2021-10-16 NOTE — Anesthesia Preprocedure Evaluation (Signed)
Anesthesia Evaluation  Patient identified by MRN, date of birth, ID band Patient confused    Reviewed: Allergy & Precautions, NPO status , Patient's Chart, lab work & pertinent test resultsPreop documentation limited or incomplete due to emergent nature of procedure.  Airway Mallampati: II  TM Distance: >3 FB Neck ROM: Full    Dental no notable dental hx.    Pulmonary pneumonia, unresolved,  COVID pneumonia, change in mental status 91% on BiPAP    Pulmonary exam normal breath sounds clear to auscultation       Cardiovascular negative cardio ROS Normal cardiovascular exam Rhythm:Regular Rate:Normal     Neuro/Psych negative neurological ROS  negative psych ROS   GI/Hepatic negative GI ROS, Neg liver ROS,   Endo/Other  negative endocrine ROS  Renal/GU ARFRenal diseaseCr 3.42, K 4.5  negative genitourinary   Musculoskeletal negative musculoskeletal ROS (+)   Abdominal   Peds negative pediatric ROS (+)  Hematology  (+) Blood dyscrasia, anemia , Hb 10.6 INR 1.8   Anesthesia Other Findings   Reproductive/Obstetrics negative OB ROS                             Anesthesia Physical Anesthesia Plan  ASA: 4  Anesthesia Plan: General   Post-op Pain Management:    Induction: Intravenous  PONV Risk Score and Plan: Treatment may vary due to age or medical condition  Airway Management Planned: Oral ETT  Additional Equipment: None  Intra-op Plan:   Post-operative Plan: Post-operative intubation/ventilation  Informed Consent:     Only emergency history available  Plan Discussed with: CRNA  Anesthesia Plan Comments: (Emergent ICU intubation COVID +)        Anesthesia Quick Evaluation

## 2021-10-16 NOTE — Consult Note (Signed)
[place] Nurse wound follow up Wound type: Sacral and back wounds, not progressing. Wound status is consistent with patient overall status. Seen last by my associate, M. Austin on 10/11/21. Multiple episodes of bloody stool per rectum is reported by Bedside RN, the most recent 30 minutes prior to my arrival.  Upon turning, patient had another large, bloody stool during this encounter.  Wound Measurements: Wound type: Unstageable pressure injury; sacrum; 3cm x 4cm x 0cm; 100% softening eschar (slightly larger; small area at 6 o'clock where eschar is separating from epidermis) Unstageable pressure injury; left ischium; 1cm x 1cm x 0.1cm: 100% eschar (Unchanged) Stage 2 pressure injury; 1cm x 1cm x 0.2cm; 100%  pink (Uncharged) Unstageable pressure injury; lumbar wound; 1cm x 1.5cm x 0.1cm; 100% yellow (Unchanged) Stage 2 pressure injury; lumbar wound; 1cm x 1cm x 0.1cm; 100% pink (Unchanged) Partial thickness abrasion; right elbow; 100% pink (Unchanged) Stage 2 pressure injury: left elbow; 2cm x 3.5cm x 0.2cm: 100% pink  (Unchanged) MASD : Severe moisture associated skin damage on the lateral rib cage on each side; right side>left side and weeping each area aprox. 10cmx 10cm x 0.1cm; red/weeping (Today this is red, partial thickness and moist, but not weeping) Partial thickness scabbing in the pattern of the recliner on the bilateral trochanter region (Still present, dry abrasion) Partial thickness abrasion; left elbow (Unchanged) Pressure vs moisture; posterior scrotum; 2cm x 2cm. Today also with anterior scrotum 2cm x 2cm x 0.1cm (partial thickness MASD)   Wound bed: As noted above Drainage (amount, consistency, odor) As noted above Periwound: Intact Dressing procedure/placement/frequency: The POC from last week is still appropriate. Nutrition and repositioning remain the cornerstone of care for the integumentary system.  Until stooling is controlled, collagenase and white petrolatum gauze are  adequate for debridement.  No overt signs of infection at wound sites is noted.  Prevalon pressure redistribution heel boots are not in place today, but heels are intact and floated. Will reorder.  [place] nursing team will not follow, but will remain available to this patient, the nursing and medical teams.  Please re-consult if needed. Thanks, Maudie Flakes, MSN, RN, Pump Back, Arther Abbott  Pager# [phone]

## 2021-10-16 NOTE — Transfer of Care (Signed)
Immediate Anesthesia Transfer of Care Note  Patient: Brandon Bond  Procedure(s) Performed: AN AD H[place] INTUBATION  Patient Location: PACU  Anesthesia Type:General  Level of Consciousness: Patient remains intubated per anesthesia plan  Airway & Oxygen Therapy: Patient placed on Ventilator (see vital sign flow sheet for setting)  Post-op Assessment: Report given to RN and Post -op Vital signs reviewed and stable  Post vital signs: stable  Last Vitals:  Vitals Value Taken Time  BP 139/87 10/16/21 2340  Temp 36.1 C 10/16/21 2340  Pulse 101 10/16/21 2340  Resp 15 10/16/21 2340  SpO2 100 % 10/16/21 2340  Vitals shown include unvalidated device data.  Last Pain:  Vitals:   10/16/21 2000  TempSrc: Bladder  PainSc:          Complications: No notable events documented.

## 2021-10-17 ENCOUNTER — Encounter (HOSPITAL_COMMUNITY): Payer: Self-pay | Admitting: Internal Medicine

## 2021-10-17 DIAGNOSIS — J9601 Acute respiratory failure with hypoxia: Secondary | ICD-10-CM | POA: Diagnosis not present

## 2021-10-17 DIAGNOSIS — N179 Acute kidney failure, unspecified: Secondary | ICD-10-CM | POA: Diagnosis not present

## 2021-10-17 DIAGNOSIS — J151 Pneumonia due to Pseudomonas: Secondary | ICD-10-CM | POA: Diagnosis not present

## 2021-10-17 LAB — BLOOD GAS, ARTERIAL
Acid-base deficit: 6.6 mmol/L — ABNORMAL HIGH (ref 0.0–2.0)
Bicarbonate: 18.5 mmol/L — ABNORMAL LOW (ref 20.0–28.0)
Drawn by: 59133
FIO2: 100 %
O2 Saturation: 99.4 %
Patient temperature: 36.1
pCO2 arterial: 34 mmHg (ref 32–48)
pH, Arterial: 7.34 — ABNORMAL LOW (ref 7.35–7.45)
pO2, Arterial: 88 mmHg (ref 83–108)

## 2021-10-17 LAB — BASIC METABOLIC PANEL
Anion gap: 9 (ref 5–15)
BUN: 175 mg/dL — ABNORMAL HIGH (ref 8–23)
CO2: 20 mmol/L — ABNORMAL LOW (ref 22–32)
Calcium: 8.1 mg/dL — ABNORMAL LOW (ref 8.9–10.3)
Chloride: 112 mmol/L — ABNORMAL HIGH (ref 98–111)
Creatinine, Ser: 3.33 mg/dL — ABNORMAL HIGH (ref 0.61–1.24)
GFR, Estimated: 20 mL/min — ABNORMAL LOW (ref >60)
Glucose, Bld: 116 mg/dL — ABNORMAL HIGH (ref 70–99)
Potassium: 4.2 mmol/L (ref 3.5–5.1)
Sodium: 141 mmol/L (ref 135–145)

## 2021-10-17 LAB — CBC
HCT: 25.5 % — ABNORMAL LOW (ref 39.0–52.0)
Hemoglobin: 9 g/dL — ABNORMAL LOW (ref 13.0–17.0)
MCH: 30.1 pg (ref 26.0–34.0)
MCHC: 35.3 g/dL (ref 30.0–36.0)
MCV: 85.3 fL (ref 80.0–100.0)
Platelets: 198 10*3/uL (ref 150–400)
RBC: 2.99 MIL/uL — ABNORMAL LOW (ref 4.22–5.81)
RDW: 16.1 % — ABNORMAL HIGH (ref 11.5–15.5)
WBC: 15.9 10*3/uL — ABNORMAL HIGH (ref 4.0–10.5)
nRBC: 0 % (ref 0.0–0.2)

## 2021-10-17 LAB — PHOSPHORUS: Phosphorus: 5.1 mg/dL — ABNORMAL HIGH (ref 2.5–4.6)

## 2021-10-17 LAB — MAGNESIUM: Magnesium: 1.8 mg/dL (ref 1.7–2.4)

## 2021-10-17 LAB — CBC WITH DIFFERENTIAL/PLATELET
Abs Immature Granulocytes: 0.09 10*3/uL — ABNORMAL HIGH (ref 0.00–0.07)
Basophils Absolute: 0 10*3/uL (ref 0.0–0.1)
Basophils Relative: 0 %
Eosinophils Absolute: 0 10*3/uL (ref 0.0–0.5)
Eosinophils Relative: 0 %
HCT: 17.7 % — ABNORMAL LOW (ref 39.0–52.0)
Hemoglobin: 6.3 g/dL — CL (ref 13.0–17.0)
Immature Granulocytes: 1 %
Lymphocytes Relative: 2 %
Lymphs Abs: 0.3 10*3/uL — ABNORMAL LOW (ref 0.7–4.0)
MCH: 29.9 pg (ref 26.0–34.0)
MCHC: 35.6 g/dL (ref 30.0–36.0)
MCV: 83.9 fL (ref 80.0–100.0)
Monocytes Absolute: 0.3 10*3/uL (ref 0.1–1.0)
Monocytes Relative: 2 %
Neutro Abs: 15.5 10*3/uL — ABNORMAL HIGH (ref 1.7–7.7)
Neutrophils Relative %: 95 %
Platelets: 152 10*3/uL (ref 150–400)
RBC: 2.11 MIL/uL — ABNORMAL LOW (ref 4.22–5.81)
RDW: 15.6 % — ABNORMAL HIGH (ref 11.5–15.5)
WBC: 16.2 10*3/uL — ABNORMAL HIGH (ref 4.0–10.5)
nRBC: 0 % (ref 0.0–0.2)

## 2021-10-17 LAB — MRSA NEXT GEN BY PCR, NASAL: MRSA by PCR Next Gen: NOT DETECTED

## 2021-10-17 LAB — GLUCOSE, CAPILLARY
Glucose-Capillary: 137 mg/dL — ABNORMAL HIGH (ref 70–99)
Glucose-Capillary: 115 mg/dL — ABNORMAL HIGH (ref 70–99)
Glucose-Capillary: 109 mg/dL — ABNORMAL HIGH (ref 70–99)
Glucose-Capillary: 97 mg/dL (ref 70–99)
Glucose-Capillary: 103 mg/dL — ABNORMAL HIGH (ref 70–99)
Glucose-Capillary: 101 mg/dL — ABNORMAL HIGH (ref 70–99)

## 2021-10-17 LAB — EXPECTORATED SPUTUM ASSESSMENT W GRAM STAIN, RFLX TO RESP C

## 2021-10-17 LAB — LACTIC ACID, PLASMA: Lactic Acid, Venous: 2.6 mmol/L (ref 0.5–1.9)

## 2021-10-17 LAB — PROCALCITONIN: Procalcitonin: 2.49 ng/mL

## 2021-10-17 MED ORDER — ALBUMIN HUMAN 25 % IV SOLN
12.5000 g | Freq: Four times a day (QID) | INTRAVENOUS | Status: AC
  Administered 2021-10-17 (×3): 12.5 g via INTRAVENOUS
  Filled 2021-10-17 (×3): qty 50

## 2021-10-17 MED ORDER — ACETAMINOPHEN 160 MG/5ML PO SOLN
650.0000 mg | Freq: Four times a day (QID) | ORAL | Status: DC | PRN
  Administered 2021-10-17 – 2021-10-23 (×6): 650 mg
  Filled 2021-10-17 (×6): qty 20.3

## 2021-10-17 MED ORDER — ORAL CARE MOUTH RINSE
15.0000 mL | OROMUCOSAL | Status: DC
  Administered 2021-10-17 – 2021-10-23 (×64): 15 mL via OROMUCOSAL

## 2021-10-17 MED ORDER — FENTANYL BOLUS VIA INFUSION
50.0000 ug | INTRAVENOUS | Status: DC | PRN
  Administered 2021-10-17 – 2021-10-20 (×14): 50 ug via INTRAVENOUS
  Filled 2021-10-17: qty 100

## 2021-10-17 MED ORDER — MIDAZOLAM HCL 2 MG/2ML IJ SOLN
2.0000 mg | INTRAMUSCULAR | Status: DC | PRN
  Administered 2021-10-17 – 2021-10-20 (×2): 2 mg via INTRAVENOUS
  Filled 2021-10-17 (×2): qty 2

## 2021-10-17 MED ORDER — FENTANYL 2500MCG IN NS 250ML (10MCG/ML) PREMIX INFUSION
50.0000 ug/h | INTRAVENOUS | Status: DC
  Administered 2021-10-17: 10:00:00 50 ug/h via INTRAVENOUS
  Administered 2021-10-17: 20:00:00 100 ug/h via INTRAVENOUS
  Administered 2021-10-18 – 2021-10-19 (×2): 50 ug/h via INTRAVENOUS
  Administered 2021-10-20: 100 ug/h via INTRAVENOUS
  Administered 2021-10-22: 50 ug/h via INTRAVENOUS
  Administered 2021-10-23: 100 ug/h via INTRAVENOUS
  Filled 2021-10-17 (×7): qty 250

## 2021-10-17 MED ORDER — NEPRO/CARBSTEADY PO LIQD
1000.0000 mL | ORAL | Status: DC
  Administered 2021-10-18: 1000 mL
  Filled 2021-10-17 (×3): qty 1000

## 2021-10-17 MED ORDER — FENTANYL CITRATE (PF) 100 MCG/2ML IJ SOLN
100.0000 ug | INTRAMUSCULAR | Status: DC | PRN
  Administered 2021-10-17 (×4): 100 ug via INTRAVENOUS
  Filled 2021-10-17 (×4): qty 2

## 2021-10-17 MED ORDER — FUROSEMIDE 10 MG/ML IJ SOLN
40.0000 mg | Freq: Once | INTRAMUSCULAR | Status: AC
  Administered 2021-10-17: 40 mg via INTRAVENOUS
  Filled 2021-10-17: qty 4

## 2021-10-17 MED ORDER — CALCIUM CHLORIDE 10 % IV SOLN
2.0000 g | Freq: Once | INTRAVENOUS | Status: DC
Start: 2021-10-18 — End: 2021-10-18

## 2021-10-17 MED ORDER — PROSOURCE TF PO LIQD
90.0000 mL | Freq: Two times a day (BID) | ORAL | Status: DC
  Administered 2021-10-17 – 2021-10-23 (×10): 90 mL
  Filled 2021-10-17 (×12): qty 90

## 2021-10-17 MED ORDER — FENTANYL CITRATE (PF) 100 MCG/2ML IJ SOLN: 50.0000 ug | Freq: Once | INTRAMUSCULAR | Status: DC

## 2021-10-17 MED ORDER — MIDAZOLAM HCL 2 MG/2ML IJ SOLN
2.0000 mg | INTRAMUSCULAR | Status: DC | PRN
  Administered 2021-10-21: 2 mg via INTRAVENOUS
  Filled 2021-10-17 (×2): qty 2

## 2021-10-17 MED ORDER — ETOMIDATE 2 MG/ML IV SOLN
INTRAVENOUS | Status: AC
  Filled 2021-10-17: qty 10

## 2021-10-17 MED ORDER — SUCCINYLCHOLINE CHLORIDE 200 MG/10ML IV SOSY
PREFILLED_SYRINGE | INTRAVENOUS | Status: AC
  Filled 2021-10-17: qty 10

## 2021-10-17 MED ORDER — JUVEN PO PACK
1.0000 | PACK | Freq: Two times a day (BID) | ORAL | Status: DC
  Filled 2021-10-17: qty 1

## 2021-10-17 MED ORDER — CHLORHEXIDINE GLUCONATE 0.12% ORAL RINSE (MEDLINE KIT)
15.0000 mL | Freq: Two times a day (BID) | OROMUCOSAL | Status: DC
  Administered 2021-10-17 – 2021-10-23 (×12): 15 mL via OROMUCOSAL

## 2021-10-17 NOTE — Progress Notes (Signed)
This patient had a bloody BM, after being turned for cleaning, pt desaturated into 60% range, HFNC was increased to 15L and NRB was added at 15L per RT. RT came into the room and placed patient on BiPAP. Pt became unresponsive to verbal commands, painful stimuli, and corneal reflexes and this nurse observed the patient with paradoxical breathing. E-link was notified, anesthesia was called per physician request, labs were drawn (this nurse retrieved labs from PICC and ABG by RT). LR bolus 1000 mL administered. Anesthesia arrived and patient was successfully reintubated with the provider on e-link, anesthesia/RT/this nurse at bedside based on lab values, breathing pattern, and current unresponsive state. X-ray was obtained at this time to verify placement of ET tube as well as verification of current NG tube due to possible movement. The patient's significant other was called and updated on the patient status; significany other did not have any questions at this time.

## 2021-10-17 NOTE — Progress Notes (Signed)
[facility] Gastroenterology Progress Note  Shermar Friedland 61 y.o. Jan 24, 1961  CC:  GI bleeding   Subjective: Patient examined at bedside.  Patient intubated resting in hospital bed.  Per RN report patient had maroon stools yesterday.  ROS : Unable to perform ROS: Acuity of condition       Objective: Vital signs in last 24 hours: Vitals:   10/17/21 1000 10/17/21 1100  BP: 119/69 117/68  Pulse: (!) 108 (!) 101  Resp: (!) 26 (!) 28  Temp: 99.3 F (37.4 C) 99.3 F (37.4 C)  SpO2: 94% 93%    Physical Exam:  General:  no distress, appears stated age  Head:  Normocephalic, without obvious abnormality, atraumatic  Eyes:  Anicteric sclera, EOM's intact  Lungs:   Clear to auscultation bilaterally, respirations unlabored  Heart:  Regular rate and rhythm, S1, S2 normal  Abdomen:   Soft, non-tender, bowel sounds active all four quadrants,  no masses,   Extremities: Extremities normal, atraumatic, no  edema  Pulses: 2+ and symmetric    Lab Results: Recent Labs    10/16/21 0540 10/16/21 2241 10/17/21 0333  NA 139 142 141  K 4.9 4.5 4.2  CL 112* 111 112*  CO2 19* 21* 20*  GLUCOSE 120* 140* 116*  BUN 160* 173* 175*  CREATININE 3.51* 3.42* 3.33*  CALCIUM 8.4* 8.3* 8.1*  MG 2.2  --  1.8  PHOS 4.5  --  5.1*   No results for input(s): AST, ALT, ALKPHOS, BILITOT, PROT, ALBUMIN in the last 72 hours. Recent Labs    10/15/21 0615 10/15/21 0900 10/16/21 2241 10/17/21 0333  WBC 13.9*   < > 36.7* 15.9*  NEUTROABS 13.1*  --   --   --   HGB 8.6*   < > 10.6* 9.0*  HCT 25.7*   < > 30.2* 25.5*  MCV 88.3   < > 85.8 85.3  PLT 174   < > 294 198   < > = values in this interval not displayed.   Recent Labs    10/15/21 0258  LABPROT 21.0*  INR 1.8*      Assessment GI bleed, acute blood loss anemia -CT abdomen pelvis without contrast on admission showed no acute GI changes -No previous history of colonoscopy -Hgb 9.0 (10.6 yesterday) -Leukocytosis improving with WBC  15.9 -Bleeding scan 2/26 negative   COVID-pneumonia and respiratory failure   Septic shock   Acute metabolic encephalopathy   AKI -BUN 175, creatinine 3.33 -GFR 20   Plan: GI bleed in a patient with multiple comorbidities including. COVID-pneumonia and respiratory failure that is currently intubated.  Negative bleeding scan. Now having maroon stools. Unable to get CTA due to AKI.  Continue Protonix IV twice daily for now Continue daily CBC and transfuse as needed to maintain HGB > 8 Patient scheduled for bedside flex-sig tomorrow. [facility] will follow  Emmit Alexanders PA-C 10/17/2021, 1:14 PM  Contact #  [phone]

## 2021-10-17 NOTE — Progress Notes (Signed)
PT Cancellation Note  Patient Details Name: Brandon Bond MRN: [id number] DOB: August 30, 1960   Cancelled Treatment:    Reason Eval/Treat Not Completed: Medical issues which prohibited therapy (pt re-intubated last night. Will follow.)   Philomena Doheny PT 10/17/2021  Acute Rehabilitation Services Pager [phone] Office [phone]

## 2021-10-17 NOTE — Consult Note (Signed)
Brandon Bond  HISTORY AND PHYSICAL  Brandon Bond is an 61 y.o. male.    Chief Complaint: AMS and weakness  HPI: Pt is a 43M with no sig medical history who is now seen in consultation at the request of Dr. Elsworth Soho for eval and recs re: AKI and azotemia.  Pt was brought in 10/10/21 after AMS and weakness for 2-3 days.  Was found in urine and feces in his recliner.  Sats in the 70s.  Was found to be COVID positive.    Unclear how long he had been in recliner and how he been cared for at home.  Ultimately has multisystem organ failure- septic shock (resolved), COVID pneumonia, AKI, azotemia, and AMS.  He was intubated 2/23 and extubated yesterday- reintubated afer about 8 hrs d/t inability to clear secretions and hypercarbia.  He is having maroon stools now, thought to be lower GI bleed- Gi to do flex sig tomorrow.    Has received remdesivir and decadron for COVID pneumonia.  He is also s/p broad-spectrum antibiotics.    Cr was initially 3.4, adired at 1.49 on 2/23 and now is back up to 3.33.  BUN has been steadily climbing and is now up to 175.    PMH: History reviewed. No pertinent past medical history. PSH: History reviewed. No pertinent surgical history.   History reviewed. No pertinent past medical history.  Medications:  Scheduled:  chlorhexidine  15 mL Mouth Rinse BID   chlorhexidine gluconate (MEDLINE KIT)  15 mL Mouth Rinse BID   Chlorhexidine Gluconate Cloth  6 each Topical Q0600   collagenase   Topical Daily   docusate sodium  100 mg Oral BID   feeding supplement (NEPRO CARB STEADY)  1,000 mL Per Tube Q24H   feeding supplement (PROSource TF)  90 mL Per Tube BID   fentaNYL (SUBLIMAZE) injection  50 mcg Intravenous Once   hydrocortisone  25 mg Rectal BID   mouth rinse  15 mL Mouth Rinse 10 times per day   nutrition supplement (JUVEN)  1 packet Per Tube BID BM   pantoprazole (PROTONIX) IV  40 mg Intravenous Q12H   permethrin   Topical Once   polyethylene glycol   17 g Oral Daily   sodium chloride flush  10-40 mL Intracatheter Q12H    No medications prior to admission.    ALLERGIES:  No Known Allergies  FAM HX: Family History  Family history unknown: Yes    Social History:   has no history on file for tobacco use, alcohol use, and drug use.  ROS: ROS: unable to perform complete ROS d/t intubation/ sedation  Blood pressure 120/69, pulse (!) 109, temperature 99.9 F (37.7 C), resp. rate (!) 31, height _0  (1.803 m), weight 63.5 kg, SpO2 93 %. PHYSICAL EXAM: Physical Exam GEN ill-appearing HEENT + temporal wasting NECK no JVD PULM mech bilaterally CV RRR ABD soft EXT 2+ anasarca NEURO intubated, sedated    Results for orders placed or performed during the hospital encounter of 09/25/2021 (from the past 48 hour(s))  Hemoglobin and hematocrit, blood     Status: Abnormal   Collection Time: 10/15/21  9:04 PM  Result Value Ref Range   Hemoglobin 9.7 (L) 13.0 - 17.0 g/dL   HCT 27.5 (L) 39.0 - 52.0 %    Comment: Performed at [facility], [place] [address] [place], [place] [address]  Glucose, capillary     Status: Abnormal   Collection Time: 10/15/21 11:00 PM  Result  Value Ref Range   Glucose-Capillary 102 (H) 70 - 99 mg/dL    Comment: Glucose reference range applies only to samples taken after fasting for at least 8 hours.  CBC     Status: Abnormal   Collection Time: 10/16/21  5:40 AM  Result Value Ref Range   WBC 20.3 (H) 4.0 - 10.5 K/uL   RBC 3.15 (L) 4.22 - 5.81 MIL/uL   Hemoglobin 9.4 (L) 13.0 - 17.0 g/dL   HCT 26.4 (L) 39.0 - 52.0 %   MCV 83.8 80.0 - 100.0 fL   MCH 29.8 26.0 - 34.0 pg   MCHC 35.6 30.0 - 36.0 g/dL   RDW 15.7 (H) 11.5 - 15.5 %   Platelets 221 150 - 400 K/uL   nRBC 0.0 0.0 - 0.2 %    Comment: Performed at [facility], [place] [address], [place], [place] [address]  Basic metabolic panel     Status: Abnormal   Collection Time: 10/16/21  5:40 AM  Result Value Ref Range    Sodium 139 135 - 145 mmol/L   Potassium 4.9 3.5 - 5.1 mmol/L    Comment: DELTA CHECK NOTED NO VISIBLE HEMOLYSIS    Chloride 112 (H) 98 - 111 mmol/L   CO2 19 (L) 22 - 32 mmol/L   Glucose, Bld 120 (H) 70 - 99 mg/dL    Comment: Glucose reference range applies only to samples taken after fasting for at least 8 hours.   BUN 160 (H) 8 - 23 mg/dL    Comment: RESULTS CONFIRMED BY MANUAL DILUTION   Creatinine, Ser 3.51 (H) 0.61 - 1.24 mg/dL    Comment: DELTA CHECK NOTED   Calcium 8.4 (L) 8.9 - 10.3 mg/dL   GFR, Estimated 19 (L) >60 mL/min    Comment: (NOTE) Calculated using the CKD-EPI Creatinine Equation (2021)    Anion gap 8 5 - 15    Comment: Performed at [facility], [place] [address], [place], [place] [address]  Magnesium     Status: None   Collection Time: 10/16/21  5:40 AM  Result Value Ref Range   Magnesium 2.2 1.7 - 2.4 mg/dL    Comment: Performed at [facility], [place] [address], [place], [place] [address]  Phosphorus     Status: None   Collection Time: 10/16/21  5:40 AM  Result Value Ref Range   Phosphorus 4.5 2.5 - 4.6 mg/dL    Comment: Performed at [facility], [place] [address] [place], [place] [address]  Glucose, capillary     Status: Abnormal   Collection Time: 10/16/21  7:44 AM  Result Value Ref Range   Glucose-Capillary 122 (H) 70 - 99 mg/dL    Comment: Glucose reference range applies only to samples taken after fasting for at least 8 hours.  Glucose, capillary     Status: Abnormal   Collection Time: 10/16/21 11:35 AM  Result Value Ref Range   Glucose-Capillary 119 (H) 70 - 99 mg/dL    Comment: Glucose reference range applies only to samples taken after fasting for at least 8 hours.  Glucose, capillary     Status: Abnormal   Collection Time: 10/16/21  3:33 PM  Result Value Ref Range   Glucose-Capillary 121 (H) 70 - 99 mg/dL    Comment: Glucose reference range applies only to samples taken after fasting for at  least 8 hours.   Comment 1 Notify RN    Comment 2 Document in Chart  Glucose, capillary     Status: Abnormal   Collection Time: 10/16/21  7:41 PM  Result Value Ref Range   Glucose-Capillary 112 (H) 70 - 99 mg/dL    Comment: Glucose reference range applies only to samples taken after fasting for at least 8 hours.  Glucose, capillary     Status: Abnormal   Collection Time: 10/16/21 10:32 PM  Result Value Ref Range   Glucose-Capillary 137 (H) 70 - 99 mg/dL    Comment: Glucose reference range applies only to samples taken after fasting for at least 8 hours.  Basic metabolic panel     Status: Abnormal   Collection Time: 10/16/21 10:41 PM  Result Value Ref Range   Sodium 142 135 - 145 mmol/L   Potassium 4.5 3.5 - 5.1 mmol/L   Chloride 111 98 - 111 mmol/L   CO2 21 (L) 22 - 32 mmol/L   Glucose, Bld 140 (H) 70 - 99 mg/dL    Comment: Glucose reference range applies only to samples taken after fasting for at least 8 hours.   BUN 173 (H) 8 - 23 mg/dL    Comment: RESULTS CONFIRMED BY MANUAL DILUTION   Creatinine, Ser 3.42 (H) 0.61 - 1.24 mg/dL   Calcium 8.3 (L) 8.9 - 10.3 mg/dL   GFR, Estimated 20 (L) >60 mL/min    Comment: (NOTE) Calculated using the CKD-EPI Creatinine Equation (2021)    Anion gap 10 5 - 15    Comment: Performed at [facility], [place] [address] [place], [place] [address]  CBC     Status: Abnormal   Collection Time: 10/16/21 10:41 PM  Result Value Ref Range   WBC 36.7 (H) 4.0 - 10.5 K/uL   RBC 3.52 (L) 4.22 - 5.81 MIL/uL   Hemoglobin 10.6 (L) 13.0 - 17.0 g/dL   HCT 30.2 (L) 39.0 - 52.0 %   MCV 85.8 80.0 - 100.0 fL   MCH 30.1 26.0 - 34.0 pg   MCHC 35.1 30.0 - 36.0 g/dL   RDW 16.2 (H) 11.5 - 15.5 %   Platelets 294 150 - 400 K/uL   nRBC 0.1 0.0 - 0.2 %    Comment: Performed at [facility], [place] [address], [place], [place] [address]  Lactic acid, plasma     Status: Abnormal   Collection Time: 10/16/21 10:43 PM  Result Value Ref  Range   Lactic Acid, Venous 2.2 (HH) 0.5 - 1.9 mmol/L    Comment: CRITICAL VALUE NOTED.  VALUE IS CONSISTENT WITH PREVIOUSLY REPORTED AND CALLED VALUE. Performed at [facility], [place] [address], [place], [place] [address]   Blood gas, arterial     Status: Abnormal   Collection Time: 10/16/21 10:46 PM  Result Value Ref Range   FIO2 60 %   Mode BILEVEL POSITIVE AIRWAY PRESSURE    Inspiratory PAP 15 cmH2O   Expiratory PAP 5 cmH2O   pH, Arterial 7.19 (LL) 7.35 - 7.45    Comment: CRITICAL RESULT CALLED TO, READ BACK BY AND VERIFIED WITH:  TROY BARSKDALE RN 10/16/21 @ 2304 VS    pCO2 arterial 52 (H) 32 - 48 mmHg   pO2, Arterial 80 (L) 83 - 108 mmHg   Bicarbonate 19.8 (L) 20.0 - 28.0 mmol/L   Acid-base deficit 8.9 (H) 0.0 - 2.0 mmol/L   O2 Saturation 96.1 %   Patient temperature 36.5    Collection site LEFT RADIAL    Drawn by COLLECTED BY RT    Allens  test (pass/fail) PASS PASS    Comment: Performed at [facility], [place] [address], [place], [place] [address]  Expectorated Sputum Assessment w Gram Stain, Rflx to Resp Cult     Status: None   Collection Time: 10/16/21 11:42 PM   Specimen: Tracheal Aspirate; Sputum  Result Value Ref Range   Specimen Description TRACHEAL ASPIRATE    Special Requests NONE    Sputum evaluation      THIS SPECIMEN IS ACCEPTABLE FOR SPUTUM CULTURE Performed at [facility], [place] [address], [place], [place] [address]    Report Status 10/17/2021 FINAL   Culture, Respiratory w Gram Stain     Status: None (Preliminary result)   Collection Time: 10/16/21 11:42 PM   Specimen: Tracheal Aspirate  Result Value Ref Range   Specimen Description      TRACHEAL ASPIRATE Performed at [place] [address], [place], [place] [address]    Special Requests      NONE Reflexed from [id number] Performed at [facility], [place] [address], [place], [place] [address]    Gram Stain       FEW SQUAMOUS EPITHELIAL CELLS PRESENT FEW WBC SEEN RARE GRAM NEGATIVE RODS FEW GRAM POSITIVE RODS FEW GRAM POSITIVE COCCI Performed at [facility], [place] [address], [place], [place] [address]    Culture PENDING    Report Status PENDING   Blood gas, arterial     Status: Abnormal   Collection Time: 10/17/21 12:15 AM  Result Value Ref Range   FIO2 100 %   pH, Arterial 7.34 (L) 7.35 - 7.45   pCO2 arterial 34 32 - 48 mmHg   pO2, Arterial 88 83 - 108 mmHg   Bicarbonate 18.5 (L) 20.0 - 28.0 mmol/L   Acid-base deficit 6.6 (H) 0.0 - 2.0 mmol/L   O2 Saturation 99.4 %   Patient temperature 36.1    Collection site LEFT RADIAL    Drawn by [id number]     Comment: COLLECTED BY RT   Allens test (pass/fail) PASS PASS    Comment: Performed at [facility], [place] [address], [place], [place] [address]  CBC     Status: Abnormal   Collection Time: 10/17/21  3:33 AM  Result Value Ref Range   WBC 15.9 (H) 4.0 - 10.5 K/uL   RBC 2.99 (L) 4.22 - 5.81 MIL/uL   Hemoglobin 9.0 (L) 13.0 - 17.0 g/dL   HCT 25.5 (L) 39.0 - 52.0 %   MCV 85.3 80.0 - 100.0 fL   MCH 30.1 26.0 - 34.0 pg   MCHC 35.3 30.0 - 36.0 g/dL   RDW 16.1 (H) 11.5 - 15.5 %   Platelets 198 150 - 400 K/uL   nRBC 0.0 0.0 - 0.2 %    Comment: Performed at [facility], [place] [address], [place], [place] [address]  Basic metabolic panel     Status: Abnormal   Collection Time: 10/17/21  3:33 AM  Result Value Ref Range   Sodium 141 135 - 145 mmol/L   Potassium 4.2 3.5 - 5.1 mmol/L   Chloride 112 (H) 98 - 111 mmol/L   CO2 20 (L) 22 - 32 mmol/L   Glucose, Bld 116 (H) 70 - 99 mg/dL    Comment: Glucose reference range applies only to samples taken after fasting for at least 8 hours.   BUN 175 (H) 8 - 23 mg/dL    Comment: RESULTS CONFIRMED BY MANUAL DILUTION  Creatinine, Ser 3.33 (H) 0.61 - 1.24 mg/dL   Calcium 8.1 (L) 8.9 - 10.3 mg/dL   GFR, Estimated 20 (L) >60 mL/min    Comment: (NOTE) Calculated  using the CKD-EPI Creatinine Equation (2021)    Anion gap 9 5 - 15    Comment: Performed at [facility], [place] [address] [place], [place] [address]  Magnesium     Status: None   Collection Time: 10/17/21  3:33 AM  Result Value Ref Range   Magnesium 1.8 1.7 - 2.4 mg/dL    Comment: Performed at [facility], [place] [address] [place], [place] [address]  Phosphorus     Status: Abnormal   Collection Time: 10/17/21  3:33 AM  Result Value Ref Range   Phosphorus 5.1 (H) 2.5 - 4.6 mg/dL    Comment: Performed at [facility], [place] [address] [place], [place] [address]  Procalcitonin - Baseline     Status: None   Collection Time: 10/17/21  3:33 AM  Result Value Ref Range   Procalcitonin 2.49 ng/mL    Comment:        Interpretation: PCT > 2 ng/mL: Systemic infection (sepsis) is likely, unless other causes are known. (NOTE)       Sepsis PCT Algorithm           Lower Respiratory Tract                                      Infection PCT Algorithm    ----------------------------     ----------------------------         PCT < 0.25 ng/mL                PCT < 0.10 ng/mL          Strongly encourage             Strongly discourage   discontinuation of antibiotics    initiation of antibiotics    ----------------------------     -----------------------------       PCT 0.25 - 0.50 ng/mL            PCT 0.10 - 0.25 ng/mL               OR       >80% decrease in PCT            Discourage initiation of                                            antibiotics      Encourage discontinuation           of antibiotics    ----------------------------     -----------------------------         PCT >= 0.50 ng/mL              PCT 0.26 - 0.50 ng/mL               AND       <80% decrease in PCT              Encourage initiation of  antibiotics       Encourage continuation           of antibiotics     ----------------------------     -----------------------------        PCT >= 0.50 ng/mL                  PCT > 0.50 ng/mL               AND         increase in PCT                  Strongly encourage                                      initiation of antibiotics    Strongly encourage escalation           of antibiotics                                     -----------------------------                                           PCT <= 0.25 ng/mL                                                 OR                                        > 80% decrease in PCT                                      Discontinue / Do not initiate                                             antibiotics  Performed at [place] [address], [place], [place] [address]   MRSA Next Gen by PCR, Nasal     Status: None   Collection Time: 10/17/21  3:33 AM   Specimen: Nasal Mucosa; Nasal Swab  Result Value Ref Range   MRSA by PCR Next Gen NOT DETECTED NOT DETECTED    Comment: (NOTE) The GeneXpert MRSA Assay (FDA approved for NASAL specimens only), is one component of a comprehensive MRSA colonization surveillance program. It is not intended to diagnose MRSA infection nor to guide or monitor treatment for MRSA infections. Test performance is not FDA approved in patients less than 12 years old. Performed at [facility], [place] [address], [place], [place] [address]   Lactic acid, plasma     Status: Abnormal   Collection Time: 10/17/21  3:33 AM  Result Value Ref Range   Lactic Acid, Venous 2.6 (HH) 0.5 - 1.9 mmol/L    Comment: CRITICAL VALUE NOTED.  VALUE  IS CONSISTENT WITH PREVIOUSLY REPORTED AND CALLED VALUE. Performed at [facility], [place] [address], [place], [place] [address]   Glucose, capillary     Status: Abnormal   Collection Time: 10/17/21  3:45 AM  Result Value Ref Range   Glucose-Capillary 115 (H) 70 - 99 mg/dL    Comment: Glucose reference  range applies only to samples taken after fasting for at least 8 hours.  Glucose, capillary     Status: Abnormal   Collection Time: 10/17/21  7:39 AM  Result Value Ref Range   Glucose-Capillary 109 (H) 70 - 99 mg/dL    Comment: Glucose reference range applies only to samples taken after fasting for at least 8 hours.   Comment 1 Notify RN    Comment 2 Document in Chart   Glucose, capillary     Status: None   Collection Time: 10/17/21 11:46 AM  Result Value Ref Range   Glucose-Capillary 97 70 - 99 mg/dL    Comment: Glucose reference range applies only to samples taken after fasting for at least 8 hours.   Comment 1 Notify RN    Comment 2 Document in Chart     DG Abd 1 View  Result Date: 10/16/2021 CLINICAL DATA:  Evaluate NG tube placement. EXAM: ABDOMEN - 1 VIEW COMPARISON:  October 12, 2021 FINDINGS: The NG tube terminates in the stomach with both the side port in the distal tip below the GE junction. A new feeding tube terminates in the region of the distal gastric body. No other acute abnormalities. IMPRESSION: 1. The NG tube is in good position. 2. The distal tip of the feeding tube is in the region of the gastric body. Recommend advancement before use. Electronically Signed   By: Dorise Bullion III M.D.   On: 10/16/2021 12:48   DG Chest Port 1 View  Result Date: 10/17/2021 CLINICAL DATA:  Intubation, NG tube EXAM: PORTABLE CHEST 1 VIEW COMPARISON:  10/16/2021 FINDINGS: Endotracheal tube is 5.6 cm above the carina. Feeding tube is in place with the tip in the stomach. Right PICC line tip in the SVC. Bilateral lower lobe consolidation compatible with pneumonia, similar to prior study. No visible effusions or pneumothorax. IMPRESSION: Support devices as above. Continued bilateral lower lobe consolidation compatible with pneumonia. Electronically Signed   By: Rolm Baptise M.D.   On: 10/17/2021 00:08   DG Chest Port 1 View  Result Date: 10/16/2021 CLINICAL DATA:  Pneumonia EXAM: PORTABLE  CHEST 1 VIEW COMPARISON:  Previous studies including the examination of 10/15/2021 FINDINGS: Transverse diameter of heart is in the upper limits of normal. There is interval decrease in pulmonary vascular congestion. Infiltrates are seen in both lower lung fields with slight improvement. No new infiltrates are seen. Lateral CP angles are clear. There is no pneumothorax. Tip of endotracheal tube is 6.4 cm above the carina. Enteric tube is noted traversing the esophagus. Tip of PICC line is seen in the superior vena cava. IMPRESSION: There is interval decrease in pulmonary vascular congestion. There is interval improvement in aeration of both lower lung fields suggesting resolving atelectasis/pneumonia. Electronically Signed   By: Elmer Picker M.D.   On: 10/16/2021 09:11    Assessment/Plan  AKI/ azotemia: In the setting of sepsis/ multiple insults.  Making urine.  I suspect hypoperfusion/ ATN.  Likely will end up needing CRRT--> but UOP has picked up in the last few hours after albumin challnge.  - agree with albumin/ Lasix challenge  - azotemia marked- likely  GI bleed/ steroids/ catabolic state  - if no sig improvement by AM, will start CRRT  - time-limited trial of 48-72 hrs is most appropriate  - discussed with partner at bedside, she is in agreement  2.  Acute hypoxic and hypercapneic respiratory failure: intubated, failed extubation 2/27.  Per PCCM  3.  COVID pneumonia: s/p remdesivir, decadron, antibiotics  4.  Lower GI bleed- Gi evaluation, flex sig tomorrow  5.  Dispo: ICU  Madelon Lips 10/17/2021, 4:23 PM

## 2021-10-17 NOTE — Progress Notes (Addendum)
NAME:  Brandon Bond, MRN:  [id number], DOB:  1960/12/07, LOS: 7 ADMISSION DATE:  10/12/2021, CONSULTATION DATE:  10/12/2021 REFERRING MD:  Dr. Aileen Fass, CHIEF COMPLAINT:  Acute respiratory distress    History of Present Illness:  Brandon Bond is a 61 year old male with no reported past medical history who presented to the emergency department due to altered mental status that began 3 days prior to admission.  Per report EMS found patient soiled in stool and urine in her recliner.  He was seen tachycardic and hypoxic with saturation 70% on room air.  On ED arrival patient met sepsis criteria with acute organ dysfunction.  He was also found to be COVID-positive.  Head CT negative.  CT abdomen pelvis revealed bilateral lower lobe pneumonia.  By morning of 2/23 patient was seen with progressive hypoxia and acidosis despite max BiPAP settings resulting in PCCM consult  Pertinent  Medical History  None  Significant Hospital Events: Including procedures, antibiotic start and stop dates in addition to other pertinent events   2/20 admitted with altered mental status found to be COVID-positive placed on BiPAP 2/23 failed BiPAP resulting in PCCM consult and intubation 2/24 PEEP dropped to 5 2/25 Weaned on pressure support all day, but not extubated due to poor mental status  2/26 Bright red blood rectum noted with drop in hemoglobin, bleeding scan negative 2/27 extubated -reintubated after 8h , unable to clear secretions, hypercarbic  Interim History / Subjective:   He was extubated yesterday. Later that night, he had bloody bowel movement and became hypercarbic, less responsive and respiratory distress and was reintubated by anesthesia Remains critically ill Afebrile    Objective   Blood pressure 119/68, pulse 94, temperature 98.6 F (37 C), temperature source Bladder, resp. rate (!) 23, height $RemoveBe'5\' 11"'RVxDlLntp$  (1.803 m), weight 63.5 kg, SpO2 94 %.    Vent Mode: PRVC FiO2 (%):  [40 %-100 %] 50  % Set Rate:  [18 bmp] 18 bmp Vt Set:  [600 mL] 600 mL PEEP:  [5 cmH20] 5 cmH20 Pressure Support:  [5 cmH20] 5 cmH20 Plateau Pressure:  [19 cmH20-26 cmH20] 21 cmH20   Intake/Output Summary (Last 24 hours) at 10/17/2021 0948 Last data filed at 10/17/2021 0600 Gross per 24 hour  Intake 2506.07 ml  Output 3375 ml  Net -868.93 ml    Filed Weights   10/15/21 0500 10/16/21 0500 10/17/21 0500  Weight: 64.3 kg 69.2 kg 63.5 kg    Examination: General: Acute on chronic ill-appearing middle-aged male lying in bed , sedate HEENT: [place]/AT, MM pink/moist, left pupil 3 mm right 2 mm both reactive Neuro: RASS fluctuant on intermittent fentanyl CV: s1s2 regular rate and rhythm, no murmur, rubs, or gallops,  PULM: No accessory muscle use, synchronous with the vent, scattered rhonchi  GI: soft, bowel sounds active in all 4 quadrants, non-tender, non-distended Extremities: warm/dry, no edema  Skin: no rashes or lesions  Chest x-ray 2/27 independently reviewed shows ET tube in position, no change in bibasilar pneumonia, no new effusions  Labs show improved leukocytosis, stable anemia, mild lactic acidosis, high procalcitonin, normal electrolytes, stable high BUN and creatinine    Resolved Hospital Problem list    Elevated LFTs   Assessment & Plan:  Septic shock secondary to pneumonia, resolved -Respiratory culture showed Pseudomonas, pansensitive , completed course of cefepime -urine strep antigen negative Legionella negative P: Started on linezolid overnight but MRSA PCR negative, if culture does not show MRSA, will discontinue   Acute Hypoxic and hypercapnic Respiratory  Failure -Secondary to Pseudomonas pneumonia COVID-related ARDS COPD, 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 Resume SBT's VAP bundle in place  DuoNebs  Covid  pneumonia -Completed remdesivir -We will discontinue dexamethasone (D8) since at this point not sure it is of benefit but contributing to increased BUN and critical illness neuropathy -Not a candidate for baricitinib due to superimposed pneumonia  Lower GI bleed, bleeding scan negative Hemoglobin has drifted down from 15 on admit to 8.6 at lowest -Continue Protonix every 12. -GI following, hemoglobin stable, has repeated bloody bowel movements, is a candidate for flex sig at bedside? -transfuse for hemoglobin less than 7  Acute metabolic encephalopathy -Likely multifactorial including sepsis and uremic -recurrent episodes of bradycardia due to coughing and gagging on vent/vagal response -Unequal pupils noted 2/27, head CT negative on admission P: Maintain neuro protective measures Aspirations precautions  PAD protocol -resume fentanyl drip, goal RASS 0 to -1, cannot use Precedex due to bradycardia,  Delirium precautions  Acute renal failure -Creatinine on admission 3.54, creatinine improved to 1.5, then worse -Azotemia could also be related to GI bleed and steroids -Good response to Lasix 2/25 but BUN rising -Third spacing P: Follow renal function  Monitor urine output Trend Bmet Avoid nephrotoxins May need HD for worsening azotemia , will try albumin and Lasix meantime  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 -Supportive care -Will need aggressive PT at some point  Protein calorie malnutrition - tube feeds on hold, D10  Multiple pressure ulcers present PTA including sacral, ischial tuberosity, vertebral column P: wound care consulted, no need for surgical debridement Optimize nutrition   Best Practice (right click and "Reselect all SmartList Selections" daily)   Diet/type: tubefeeds DVT prophylaxis: prophylactic heparin  GI prophylaxis: PPI Lines: N/A Foley:  N/A Code Status:  full code Last date of multidisciplinary goals of care discussion: Domestic partner  Caren Griffins updated daily  Labs   CBC: Recent Labs  Lab 10/15/21 0232 10/15/21 0615 10/15/21 0900 10/15/21 2104 10/16/21 0540 10/16/21 2241 10/17/21 0333  WBC 13.7* 13.9*  --   --  20.3* 36.7* 15.9*  NEUTROABS  --  13.1*  --   --   --   --   --   HGB 9.5* 8.6* 9.7* 9.7* 9.4* 10.6* 9.0*  HCT 26.8* 25.7* 27.8* 27.5* 26.4* 30.2* 25.5*  MCV 86.5 88.3  --   --  83.8 85.8 85.3  PLT 188 174  --   --  221 294 198     Basic Metabolic Panel: Recent Labs  Lab 10/13/21 0231 10/14/21 0338 10/15/21 0232 10/16/21 0540 10/16/21 2241 10/17/21 0333  NA 137 131* 135 139 142 141  K 4.3 4.0 4.0 4.9 4.5 4.2  CL 108 105 109 112* 111 112*  CO2 21* 17* 17* 19* 21* 20*  GLUCOSE 245* 188* 76 120* 140* 116*  BUN 94* 118* 139* 160* 173* 175*  CREATININE 2.10* 2.33* 2.42* 3.51* 3.42* 3.33*  CALCIUM 8.1* 7.9* 7.8* 8.4* 8.3* 8.1*  MG 2.3 2.1 1.9 2.2  --  1.8  PHOS 3.4 3.0 2.9 4.5  --  5.1*    GFR: Estimated Creatinine Clearance: 20.9 mL/min (A) (by C-G formula based on SCr of 3.33 mg/dL (H)). Recent Labs  Lab 10/10/21 1900 10/11/21 0250 10/15/21 0615 10/16/21 0540 10/16/21 2241 10/16/21 2243 10/17/21 0333  PROCALCITON 2.43  --   --   --   --   --  2.49  WBC  --    < > 13.9* 20.3* 36.7*  --  15.9*  LATICACIDVEN  --   --   --   --   --  2.2* 2.6*   < > = values in this interval not displayed.     Liver Function Tests: Recent Labs  Lab 10/11/21 0844 10/14/21 0338  AST 70* 25  ALT 97* 27  ALKPHOS 58 56  BILITOT 1.0 0.8  PROT 5.6* 4.2*  ALBUMIN 2.2* 1.9*    No results for input(s): LIPASE, AMYLASE in the last 168 hours. No results for input(s): AMMONIA in the last 168 hours.  ABG    Component Value Date/Time   PHART 7.34 (L) 10/17/2021 0015   PCO2ART 34 10/17/2021 0015   PO2ART 88 10/17/2021 0015   HCO3 18.5 (L) 10/17/2021 0015   ACIDBASEDEF 6.6 (H) 10/17/2021 0015   O2SAT 99.4 10/17/2021 0015      Coagulation Profile: Recent Labs  Lab 10/15/21 0258  INR 1.8*      Cardiac Enzymes: Recent Labs  Lab 10/11/21 0844  CKTOTAL 103     HbA1C: No results found for: HGBA1C  CBG: Recent Labs  Lab 10/16/21 1533 10/16/21 1941 10/16/21 2232 10/17/21 0345 10/17/21 0739  GLUCAP 121* 112* 137* 115* 109*     Critical care time:   CRITICAL CARE Performed by: Leanna Sato. Kendal Raffo  Total critical care time: 39 minutes  Critical care time was exclusive of separately billable procedures and treating other patients.  Critical care was necessary to treat or prevent imminent or life-threatening deterioration.  Critical care was time spent personally by me on the following activities: development of treatment plan with patient and/or surrogate as well as nursing, discussions with consultants, evaluation of patient's response to treatment, examination of patient, obtaining history from patient or surrogate, ordering and performing treatments and interventions, ordering and review of laboratory studies, ordering and review of radiographic studies, pulse oximetry and re-evaluation of patient's condition.    Kara Mead MD. Shade Flood. [facility] Pager : [phone]  If no response to pager , please call [phone] until 7 pm After 7:00 pm call Elink  [phone]    10/17/2021, 9:48 AM

## 2021-10-17 NOTE — Progress Notes (Signed)
Nutrition Follow-up  DOCUMENTATION CODES:   Severe malnutrition in context of chronic illness, Underweight  INTERVENTION:  - once TF able to be resumed: Nepro @ 35 ml/hr with 90 ml Prosource TF BID, and 1 packet Juven BID. - this regimen will provide 1862 kcal, 117 grams protein, and 611 ml free water.    NUTRITION DIAGNOSIS:   Severe Malnutrition related to chronic illness as evidenced by severe fat depletion, severe muscle depletion. -ongoing  GOAL:   Patient will meet greater than or equal to 90% of their needs -to be met with TF regimen  MONITOR:   Vent status, TF tolerance, Labs, Weight trends, Skin  ASSESSMENT:   61 year old white male with no medical history in our EMR. He presented to the ED due to AMS and weakness x2-3 days. EMS reported that patient was found in a recliner soiled with urine and stool. In the ED, CT head was negative for acute findings and CT chest/abdomen/pelvis showed bilateral lower lobe PNA.  Significant Events: 2/20- admission 2/23- intubation; OGT placement; initial RD assessment; initiation of TF 2/27- extubation; small bore NGT placement (gastric per abdominal x-ray); re-intubated by Anesthesia    Patient discussed in rounds this AM. Patient was re-intubated late last night and remains intubated with small bore NGT in place at this time. Small bore NGT clamped pending flex sig, hopefully to occur today (or will be tomorrow if unable today). RN shared that patient has +3-4 pitting edema to all extremities.   Weight has been fluctuating throughout hospitalization. Used weight from 2/22 (121 lb, lowest weight this admission) due to extent of edema currently present.   Moderate pitting edema to perineal area, deep pitting edema to LUE, and very deep pitting edema to RUE and BLE documented in the edema section of flow sheet.    Patient is currently intubated on ventilator support MV: 16.8 L/min Temp (24hrs), Avg:97.9 F (36.6 C), Min:97 F (36.1  C), Max:99.3 F (37.4 C) Propofol: none  Labs reviewed; CBGs: 115, 109, 97 mg/dl, Cl: 112 mmol/l, BUN: 175 mg/dl, creatinine: 3.33 mg/dl, Ca: 8.1 mg/dl, Phos: 5.1 mg/dl, GFR: 20 ml/min.  Medications reviewed; 12.5 g albumin x3 doses 2/28, 25 g albumin x1 dose 2/27, 100 mg colace BID, 40 mg IV protonix BID, 17 g miralax/day.  Drip; fentanyl @ 50 mcg/hr.  IVF; D10 @ 30 ml/hr (245 kcal/24 hrs).   Diet Order:   Diet Order             Diet NPO time specified  Diet effective midnight           Diet NPO time specified  Diet effective now                   EDUCATION NEEDS:   No education needs have been identified at this time  Skin:  Skin Assessment: Skin Integrity Issues: Skin Integrity Issues:: Stage II, Unstageable, DTI DTI: scrotum Stage II: R IT; vertebral column Unstageable: full thickness to sacrum, L IT, and vertebral column  Last BM:  2/28 (type 7 x2, one small amount and one medium amount)  Height:   Ht Readings from Last 1 Encounters:  10/16/21 _0  (1.803 m)    Weight:   Wt Readings from Last 1 Encounters:  10/17/21 63.5 kg     BMI:  Body mass index is 19.52 kg/m.  Estimated Nutritional Needs:  Kcal:  1719 kcal Protein:  110-137 grams Fluid:  >/= 2.2 L/day     Jarome Matin,  MS, RD, LDN Inpatient Clinical Dietitian RD pager # available in [place]  After hours/weekend pager # available in [facility]

## 2021-10-18 ENCOUNTER — Inpatient Hospital Stay (HOSPITAL_COMMUNITY): Payer: 59

## 2021-10-18 ENCOUNTER — Encounter (HOSPITAL_COMMUNITY): Admission: EM | Disposition: E | Payer: Self-pay | Source: Home / Self Care | Attending: Pulmonary Disease

## 2021-10-18 ENCOUNTER — Encounter (HOSPITAL_COMMUNITY): Payer: Self-pay | Admitting: Anesthesiology

## 2021-10-18 DIAGNOSIS — R627 Adult failure to thrive: Secondary | ICD-10-CM | POA: Diagnosis not present

## 2021-10-18 DIAGNOSIS — A419 Sepsis, unspecified organism: Secondary | ICD-10-CM | POA: Diagnosis not present

## 2021-10-18 DIAGNOSIS — R6520 Severe sepsis without septic shock: Secondary | ICD-10-CM | POA: Diagnosis not present

## 2021-10-18 DIAGNOSIS — Z7189 Other specified counseling: Secondary | ICD-10-CM | POA: Diagnosis not present

## 2021-10-18 DIAGNOSIS — K922 Gastrointestinal hemorrhage, unspecified: Secondary | ICD-10-CM

## 2021-10-18 DIAGNOSIS — Z515 Encounter for palliative care: Secondary | ICD-10-CM | POA: Diagnosis not present

## 2021-10-18 DIAGNOSIS — U071 COVID-19: Secondary | ICD-10-CM | POA: Diagnosis not present

## 2021-10-18 DIAGNOSIS — N179 Acute kidney failure, unspecified: Secondary | ICD-10-CM | POA: Diagnosis not present

## 2021-10-18 DIAGNOSIS — J9601 Acute respiratory failure with hypoxia: Secondary | ICD-10-CM | POA: Diagnosis not present

## 2021-10-18 HISTORY — PX: FLEXIBLE SIGMOIDOSCOPY: SHX5431

## 2021-10-18 LAB — BPAM PLATELET PHERESIS
Blood Product Expiration Date: 202303032359
Unit Type and Rh: 6200

## 2021-10-18 LAB — RENAL FUNCTION PANEL
Albumin: 2.3 g/dL — ABNORMAL LOW (ref 3.5–5.0)
Anion gap: 12 (ref 5–15)
BUN: 158 mg/dL — ABNORMAL HIGH (ref 8–23)
CO2: 23 mmol/L (ref 22–32)
Calcium: 8.2 mg/dL — ABNORMAL LOW (ref 8.9–10.3)
Chloride: 111 mmol/L (ref 98–111)
Creatinine, Ser: 2.86 mg/dL — ABNORMAL HIGH (ref 0.61–1.24)
GFR, Estimated: 24 mL/min — ABNORMAL LOW (ref >60)
Glucose, Bld: 100 mg/dL — ABNORMAL HIGH (ref 70–99)
Phosphorus: 4.9 mg/dL — ABNORMAL HIGH (ref 2.5–4.6)
Potassium: 3.1 mmol/L — ABNORMAL LOW (ref 3.5–5.1)
Sodium: 146 mmol/L — ABNORMAL HIGH (ref 135–145)

## 2021-10-18 LAB — BASIC METABOLIC PANEL
Anion gap: 6 (ref 5–15)
BUN: 167 mg/dL — ABNORMAL HIGH (ref 8–23)
CO2: 23 mmol/L (ref 22–32)
Calcium: 8.3 mg/dL — ABNORMAL LOW (ref 8.9–10.3)
Chloride: 115 mmol/L — ABNORMAL HIGH (ref 98–111)
Creatinine, Ser: 3.23 mg/dL — ABNORMAL HIGH (ref 0.61–1.24)
GFR, Estimated: 21 mL/min — ABNORMAL LOW (ref >60)
Glucose, Bld: 167 mg/dL — ABNORMAL HIGH (ref 70–99)
Potassium: 3.4 mmol/L — ABNORMAL LOW (ref 3.5–5.1)
Sodium: 144 mmol/L (ref 135–145)

## 2021-10-18 LAB — CBC
HCT: 26.2 % — ABNORMAL LOW (ref 39.0–52.0)
Hemoglobin: 9.1 g/dL — ABNORMAL LOW (ref 13.0–17.0)
MCH: 29.4 pg (ref 26.0–34.0)
MCHC: 34.7 g/dL (ref 30.0–36.0)
MCV: 84.8 fL (ref 80.0–100.0)
Platelets: 125 10*3/uL — ABNORMAL LOW (ref 150–400)
RBC: 3.09 MIL/uL — ABNORMAL LOW (ref 4.22–5.81)
RDW: 15.9 % — ABNORMAL HIGH (ref 11.5–15.5)
WBC: 20 10*3/uL — ABNORMAL HIGH (ref 4.0–10.5)
nRBC: 0 % (ref 0.0–0.2)

## 2021-10-18 LAB — HEMOGLOBIN AND HEMATOCRIT, BLOOD
HCT: 26.8 % — ABNORMAL LOW (ref 39.0–52.0)
Hemoglobin: 9.4 g/dL — ABNORMAL LOW (ref 13.0–17.0)

## 2021-10-18 LAB — GLUCOSE, CAPILLARY
Glucose-Capillary: 107 mg/dL — ABNORMAL HIGH (ref 70–99)
Glucose-Capillary: 108 mg/dL — ABNORMAL HIGH (ref 70–99)
Glucose-Capillary: 99 mg/dL (ref 70–99)
Glucose-Capillary: 116 mg/dL — ABNORMAL HIGH (ref 70–99)
Glucose-Capillary: 139 mg/dL — ABNORMAL HIGH (ref 70–99)

## 2021-10-18 LAB — LACTIC ACID, PLASMA
Lactic Acid, Venous: 2.5 mmol/L (ref 0.5–1.9)
Lactic Acid, Venous: 2.5 mmol/L (ref 0.5–1.9)

## 2021-10-18 LAB — PHOSPHORUS: Phosphorus: 5.6 mg/dL — ABNORMAL HIGH (ref 2.5–4.6)

## 2021-10-18 LAB — FIBRINOGEN: Fibrinogen: 441 mg/dL (ref 210–475)

## 2021-10-18 LAB — MAGNESIUM: Magnesium: 1.8 mg/dL (ref 1.7–2.4)

## 2021-10-18 LAB — PREPARE RBC (CROSSMATCH)

## 2021-10-18 SURGERY — SIGMOIDOSCOPY, FLEXIBLE
Anesthesia: Monitor Anesthesia Care

## 2021-10-18 MED ORDER — SODIUM CHLORIDE 0.9% IV SOLUTION: Freq: Once | INTRAVENOUS | Status: AC

## 2021-10-18 MED ORDER — TECHNETIUM TC 99M-LABELED RED BLOOD CELLS IV KIT
22.0000 | PACK | Freq: Once | INTRAVENOUS | Status: AC
  Administered 2021-10-18: 22 via INTRAVENOUS

## 2021-10-18 MED ORDER — HEPARIN SODIUM (PORCINE) 1000 UNIT/ML DIALYSIS
1000.0000 [IU] | INTRAMUSCULAR | Status: DC | PRN
  Administered 2021-10-18: 2400 [IU] via INTRAVENOUS_CENTRAL
  Administered 2021-10-21: 1000 [IU] via INTRAVENOUS_CENTRAL
  Filled 2021-10-18 (×4): qty 6
  Filled 2021-10-18: qty 4

## 2021-10-18 MED ORDER — SODIUM CHLORIDE 0.9 % FOR CRRT: INTRAVENOUS_CENTRAL | Status: DC | PRN

## 2021-10-18 MED ORDER — SODIUM CHLORIDE 0.9 % IV SOLN
20.0000 ug | Freq: Once | INTRAVENOUS | Status: AC
  Administered 2021-10-18: 20 ug via INTRAVENOUS
  Filled 2021-10-18: qty 5

## 2021-10-18 MED ORDER — VITAMIN K1 10 MG/ML IJ SOLN
10.0000 mg | Freq: Once | INTRAVENOUS | Status: AC
  Administered 2021-10-18: 10 mg via INTRAVENOUS
  Filled 2021-10-18: qty 1

## 2021-10-18 MED ORDER — FLEET ENEMA 7-19 GM/118ML RE ENEM
1.0000 | ENEMA | Freq: Once | RECTAL | Status: AC
  Administered 2021-10-18: 1 via RECTAL
  Filled 2021-10-18: qty 1

## 2021-10-18 MED ORDER — NOREPINEPHRINE 4 MG/250ML-% IV SOLN
INTRAVENOUS | Status: AC
  Administered 2021-10-18: 10 ug/min via INTRAVENOUS
  Filled 2021-10-18: qty 250

## 2021-10-18 MED ORDER — FLEET ENEMA 7-19 GM/118ML RE ENEM
1.0000 | ENEMA | Freq: Three times a day (TID) | RECTAL | Status: AC
  Administered 2021-10-18 – 2021-10-19 (×2): 1 via RECTAL
  Filled 2021-10-18 (×3): qty 1

## 2021-10-18 MED ORDER — SODIUM CHLORIDE 0.9 % IV SOLN
2.0000 g | Freq: Once | INTRAVENOUS | Status: AC
  Administered 2021-10-18: 2 g via INTRAVENOUS
  Filled 2021-10-18: qty 20

## 2021-10-18 MED ORDER — SODIUM CHLORIDE 0.9 % IV SOLN: INTRAVENOUS | Status: DC

## 2021-10-18 MED ORDER — LEVOFLOXACIN IN D5W 500 MG/100ML IV SOLN
500.0000 mg | INTRAVENOUS | Status: DC
  Administered 2021-10-19 – 2021-10-21 (×3): 500 mg via INTRAVENOUS
  Filled 2021-10-18 (×3): qty 100

## 2021-10-18 MED ORDER — SODIUM BICARBONATE 8.4 % IV SOLN
INTRAVENOUS | Status: DC
  Filled 2021-10-18: qty 150
  Filled 2021-10-18: qty 1000

## 2021-10-18 MED ORDER — LEVOFLOXACIN IN D5W 750 MG/150ML IV SOLN
750.0000 mg | Freq: Once | INTRAVENOUS | Status: AC
  Administered 2021-10-18: 750 mg via INTRAVENOUS
  Filled 2021-10-18: qty 150

## 2021-10-18 MED ORDER — PRISMASOL BGK 4/2.5 32-4-2.5 MEQ/L REPLACEMENT SOLN: Status: DC

## 2021-10-18 MED ORDER — FUROSEMIDE 10 MG/ML IJ SOLN
40.0000 mg | Freq: Once | INTRAMUSCULAR | Status: AC
Start: 2021-10-18 — End: 2021-10-18
  Administered 2021-10-18: 40 mg via INTRAVENOUS
  Filled 2021-10-18: qty 4

## 2021-10-18 MED ORDER — POTASSIUM CHLORIDE 20 MEQ PO PACK
20.0000 meq | PACK | Freq: Once | ORAL | Status: AC
Start: 2021-10-18 — End: 2021-10-18
  Administered 2021-10-18: 20 meq
  Filled 2021-10-18: qty 1

## 2021-10-18 MED ORDER — PRISMASOL BGK 4/2.5 32-4-2.5 MEQ/L EC SOLN: Status: DC

## 2021-10-18 MED ORDER — NOREPINEPHRINE 4 MG/250ML-% IV SOLN: 0.0000 ug/min | INTRAVENOUS | Status: DC

## 2021-10-18 MED ORDER — SODIUM BICARBONATE 8.4 % IV SOLN
50.0000 meq | INTRAVENOUS | Status: AC
  Administered 2021-10-18 (×3): 50 meq via INTRAVENOUS

## 2021-10-18 NOTE — Progress Notes (Addendum)
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 Failed extubation 2/27 due to secretions, weakness, Co2 retention, uremic encephalopathy.  ? ?Has been having lower GI bleeding over the course of the day, E-link called for Hb 6.3 ? ?Plans: ?-Protonix 40mg  IV q12 ?-GI consulted, possible scope in AM ?-PRBC x 3, FFP x 2, Plts x2,  ?-check Fibrinogen, TEG ?-bleeding scan done, and  was negative ?Hemoglobin has drifted down from 15 on admit to 8.6 at lowest ?-Azotemia from GI bleed, steroids ?-some response to Albumin, but still very uremic,  ?-CRRT is planned.  ? ? ?Addendum: ?-during the course of blood product resuscitation, patient began to brady down to 40's, though maintained pulse ?- suspect metabolic derangements in the setting of profound uremia ?- got Epi + Bicarb with improvement, though never lost pulse  ?- starting Bicarb infusion  ?- check stat labs, Ca, Mag  ?- d/w , patient's only proxy- she is en route. Explained situation, he will be made DNR. Likely she would not want dialysis for him either but will decide when she arrives  ?

## 2021-10-18 NOTE — Anesthesia Preprocedure Evaluation (Deleted)
Anesthesia Evaluation  ? ? ?Reviewed: ?Allergy & Precautions, Patient's Chart, lab work & pertinent test results ? ?Airway ?Mallampati: II ? ?TM Distance: >3 FB ?Neck ROM: Full ? ? ? Dental ?no notable dental hx. ? ?  ?Pulmonary ?pneumonia, unresolved,  ?COVID pneumonia, change in mental status ?91% on BiPAP ? ?  ?Pulmonary exam normal ?breath sounds clear to auscultation ? ? ? ? ? ? Cardiovascular ?negative cardio ROS ?Normal cardiovascular exam ?Rhythm:Regular Rate:Normal ? ? ?  ?Neuro/Psych ?negative neurological ROS ? negative psych ROS  ? GI/Hepatic ?negative GI ROS, Neg liver ROS,   ?Endo/Other  ?negative endocrine ROS ? Renal/GU ?ARFRenal diseaseCr 3.42, K 4.5  ?negative genitourinary ?  ?Musculoskeletal ?negative musculoskeletal ROS ?(+)  ? Abdominal ?  ?Peds ?negative pediatric ROS ?(+)  Hematology ? ?(+) anemia , Lab Results ?     Component                Value               Date                 ?     WBC                      20.0 (H)            10/25/2021           ?     HGB                      9.1 (L)             11/13/2021           ?     HCT                      26.2 (L)            11/11/2021           ?     PLT                      125 (L)             11/15/2021           ?   ?Anesthesia Other Findings ? ? Reproductive/Obstetrics ?negative OB ROS ? ?  ? ? ? ? ? ? ? ? ? ? ? ? ? ?  ?  ? ? ? ? ? ? ? ? ?Anesthesia Physical ? ?Anesthesia Plan ? ?ASA: 4 ? ?Anesthesia Plan: General  ? ?Post-op Pain Management:   ? ?Induction: Intravenous ? ?PONV Risk Score and Plan: Treatment may vary due to age or medical condition ? ?Airway Management Planned: Oral ETT ? ?Additional Equipment: None ? ?Intra-op Plan:  ? ?Post-operative Plan: Post-operative intubation/ventilation ? ?Informed Consent:  ? ? ? ?Only emergency history available ? ?Plan Discussed with: CRNA ? ?Anesthesia Plan Comments: (Emergent ICU intubation COVID +)  ? ? ? ? ? ? ?Anesthesia Quick Evaluation ? ?

## 2021-10-18 NOTE — Progress Notes (Signed)
PT Cancellation Note ? ?Patient Details ?Name: Brandon Bond ?MRN: [id number] ?DOB: Dec 31, 1960 ? ? ?Cancelled Treatment:    Reason Eval/Treat Not Completed: Medical issues which prohibited therapy (Pt remains on ventilator and has had medical decline. Will follow.) ? ? ?Ralene Bathe Kistler PT 11/05/2021  ?Acute Rehabilitation Services ?Pager [phone] ?Office [phone] ? ?

## 2021-10-18 NOTE — Progress Notes (Signed)
Patient started on CRRT around 2315. Filter pressures began at 250-300 with blood flow rate at 250 as ordered. RN adjusted blood flow rate to 200. Filter pressures now 120's. Patient became agitated and restless biting ET tube. Fentanyl bolus given. Patient remained unchanged. Fentanyl drip increased to 52ml/hr Pt currently resting and tolerating CRRT.  ?

## 2021-10-18 NOTE — Procedures (Signed)
Central Venous Catheter Insertion Procedure Note ? ?Brandon Bond  ?[id number]  ?[birth year] ? ?Date:10/21/21  ?Time:11:41 AM  ? ?Provider Performing:Patrici Minnis Celine Mans  ? ?Procedure: Insertion of Non-tunneled Central Venous Catheter(36556)with US guidance (19622)   ? ?Indication(s) ?Hemodialysis ? ?Consent ?Risks of the procedure as well as the alternatives and risks of each were explained to the patient and/or caregiver.  Consent for the procedure was obtained and is signed in the bedside chart ? ?Anesthesia ?Topical only with 1% lidocaine  ? ?Timeout ?Verified patient identification, verified procedure, site/side was marked, verified correct patient position, special equipment/implants available, medications/allergies/relevant history reviewed, required imaging and test results available. ? ?Sterile Technique ?Maximal sterile technique including full sterile barrier drape, hand hygiene, sterile gown, sterile gloves, mask, hair covering, sterile ultrasound probe cover (if used). ? ?Procedure Description ?Area of catheter insertion was cleaned with chlorhexidine and draped in sterile fashion.   With real-time ultrasound guidance a HD catheter was placed into the left internal jugular vein.  Nonpulsatile blood flow and easy flushing noted in all ports.  The catheter was sutured in place and sterile dressing applied. ? ?Complications/Tolerance ?None; patient tolerated the procedure well. ?Chest X-ray is ordered to verify placement for internal jugular or subclavian cannulation.  Chest x-ray is not ordered for femoral cannulation. ? ?EBL ?Minimal ? ?Specimen(s) ?None ? ? ?Rutherford Guys, PA - C ?[facility] ?For pager details, please see AMION or use Epic chat  ?After 1900, please call [facility] for cross coverage needs ?October 21, 2021, 11:41 AM ? ? ?

## 2021-10-18 NOTE — Progress Notes (Signed)
Chaplain engaged in a follow-up visit with Khyran's significant other.  Chaplain learned today that they have been in a domestic partnership over 20 years but never legally married.  Chaplain was also able to meet Johnny's sister.  Sister voiced that Yaiden "wife" has been by his side and should be able to make decisions for him.  She is agreeable to that.   ? ?Sister spent time thinking about Gibson's decline.  She is in shock that he has lost so much weight and even that he has facial hair.  She voiced that she had not seen him for two years.  She noted she would try to come over but that Chino always persuaded her not to come.   ? ?Boyd's partner verbalized that she wanted to honor what she believes [facility] would want.  She knows that he was quite depressed and anxious before getting sick.  She has been thinking deeply about his quality of life and the decisions she may have to make on his behalf.   ? ?Chaplain offered listening, support, and presence.  Chaplain encouraged them all to step away and get something to eat while Kylor has his procedure today.  Jaydin's significant other talked about the pressure and stress she has felt.  Chaplain affirmed her need to take care of herself and step away if needed.  ? ? ? 10/26/2021 1200  ?Clinical Encounter Type  ?Visited With Family  ?Visit Type Follow-up;Spiritual support  ?Spiritual Encounters  ?Spiritual Needs Emotional;Grief support  ? ? ?

## 2021-10-18 NOTE — Progress Notes (Signed)
Pharmacy Antibiotic Note ? ?Brandon Bond is a 61 y.o. male admitted on 10/12/2021. Patient with Covid pneumonia s/p treatment as well as pneumonia s/t pseudomonas initially treated with cefepime. Patient extubated 2/27, but required reintubation later that day due to secretions and weakness.  Pharmacy has been consulted for levofloxacin dosing. ? ?Today, 11/02/2021 ?- Overnight, patient with ongoing bloody bowel movements and drop in hemoglobin; developed bradycardia and given epinephrine ?- Patient to have bedside flex sig with GI today ?- Decline in renal function initially started on bicarb infusion, now planning to start on CRRT ?- Norepinephrine started early am, currently off ?- Repeat respiratory culture with pseudomonas pending sensitivities ? ?Plan: ?Levofloxacin 750 mg IV x 1 followed by 500 mg IV q24h for CRRT ? ?Height: 5\' 11"  (180.3 cm) ?Weight: 63.5 kg (139 lb 15.9 oz) ?IBW/kg (Calculated) : 75.3 ? ?Temp (24hrs), Avg:98.6 ?F (37 ?C), Min:96.8 ?F (36 ?C), Max:100.6 ?F (38.1 ?C) ? ?Recent Labs  ?Lab 10/15/21 ?0232 10/15/21 ?0615 10/16/21 ?[id number] 10/16/21 ?2241 10/16/21 ?2243 10/17/21 ?NO:[id number] 10/17/21 ?2325 11/07/2021 ?AK:[id number] 11/06/2021 ?0843  ?WBC 13.7*   < > 20.3* 36.7*  --  15.9* 16.2*  --  20.0*  ?CREATININE 2.42*  --  3.51* 3.42*  --  3.33*  --   --  3.23*  ?LATICACIDVEN  --   --   --   --  2.2* 2.6*  --  2.5*  --   ? < > = values in this interval not displayed.  ?  ?Estimated Creatinine Clearance: 21.6 mL/min (A) (by C-G formula based on SCr of 3.23 mg/dL (H)).   ? ?No Known Allergies ? ?Antimicrobials this admission: ?3/1 levofloxacin >> ?2/27 linezolid x 1 ?2/20 vanc >> 2/21 ?2/20 cefepime >> 2/28 ?2/20 flagyl x 1 ?2/21 RDV >> 2/25 ? ?Microbiology results: ?2/27 RCx: PsA susc pending ?2/24 RCx: PsA (pansens) ?2/20 BCx: NGF ?2/20 UCx: ngF ? ?Thank you for allowing pharmacy to be a part of this patient?s care. ? ?Tawnya Crook, PharmD, BCPS ?Clinical Pharmacist ?10/28/2021 12:42 PM ? ? ?

## 2021-10-18 NOTE — Progress Notes (Signed)
[facility] Progress Note ? ?Brandon Bond 61 y.o. 17-May-1961 ? ?CC:  GI bleed ? ? ?Subjective: ?Patient examined at bedside.  Patient intubated resting in hospital bed.   ?Overnight events reviewed. RN reports bloody bowel movements and blood with the enemas for flex-sig prep. Hemoglobin dropped to 6.3 ?Developed bradycardia to 40s , given epinephrine ?Started on Bicarbonate drip ?DNR issued ? ?ROS : Review of Systems  ?Unable to perform ROS: Intubated   ? ? ?Objective: ?Vital signs in last 24 hours: ?Vitals:  ? 10/26/2021 1010 10/29/2021 1030  ?BP: 139/74 126/86  ?Pulse: 80 82  ?Resp: 15 (!) 29  ?Temp: (!) 97.2 ?F (36.2 ?C) (!) 97.2 ?F (36.2 ?C)  ?SpO2: 97% 91%  ? ? ?Physical Exam: ? ?General:  Intubated, appears stated age  ?Head:  Normocephalic, without obvious abnormality, atraumatic  ?Eyes:  Anicteric sclera, EOM's intact, conjunctival pallor  ?Lungs:   Clear to auscultation bilaterally,   ?Heart:  Regular rate and rhythm, S1, S2 normal  ?Abdomen:   Soft, non-tender, bowel sounds active all four quadrants,  no masses,   ?Extremities: Extremities normal, atraumatic, trace LE edema  ?Pulses: 2+ and symmetric  ? ? ?Lab Results: ?Recent Labs  ?  10/17/21 ?0333 11/13/2021 ?7672  ?NA 141 144  ?K 4.2 3.4*  ?CL 112* 115*  ?CO2 20* 23  ?GLUCOSE 116* 167*  ?BUN 175* 167*  ?CREATININE 3.33* 3.23*  ?CALCIUM 8.1* 8.3*  ?MG 1.8 1.8  ?PHOS 5.1* 5.6*  ? ?No results for input(s): AST, ALT, ALKPHOS, BILITOT, PROT, ALBUMIN in the last 72 hours. ?Recent Labs  ?  10/17/21 ?2325 10/20/2021 ?0947  ?WBC 16.2* 20.0*  ?NEUTROABS 15.5*  --   ?HGB 6.3* 9.1*  ?HCT 17.7* 26.2*  ?MCV 83.9 84.8  ?PLT 152 125*  ? ?No results for input(s): LABPROT, INR in the last 72 hours. ? ? ? ?Assessment ?GI bleed, acute blood loss anemia ?-CT abdomen pelvis without contrast on admission showed no acute GI changes ?-No previous history of colonoscopy ?-Hgb 9.0 s/p 3 units pRBC and 1 unit FFP (10.6 yesterday but dropped 6.3 overnight) ?-Leukocytosis  worsening with WBC 20.0(16.2) ?-Bleeding scan 2/26 negative  ? ?S/p 3 units packed red blood cells and 1 unit of fresh frozen plasma on 10/21/2021 ?  ?COVID-pneumonia and respiratory failure ?  ?Septic shock ?  ?Acute metabolic encephalopathy ?  ?AKI ?-BUN 167, creatinine 3.23 ?-GFR 20 ? ? ?Plan: ?GI bleed in a patient with multiple comorbidities including. COVID-pneumonia and respiratory failure that is currently intubated.  Negative bleeding scan. continuing to have rectal bleeding. Unable to get CTA due to AKI.  ?Continue Protonix IV twice daily for now ?Continue daily CBC and transfuse as needed to maintain HGB > 8 ?Patient scheduled for bedside flex-sig today. ?[facility] will follow ? ?Emmit Alexanders PA-C ?11/14/2021, 10:56 AM ? ?Contact #  [phone]  ?

## 2021-10-18 NOTE — Consult Note (Signed)
? ?                                                                                ?Consultation Note ?Date: 11/05/2021  ? ?Patient Name: Brandon Bond  ?DOB: 1960/09/14  MRN: [id number]  Age / Sex: 61 y.o., male  ?PCP: Lawerance Cruel, MD ?Referring Physician: Rigoberto Noel, MD ? ?Reason for Consultation: Establishing goals of care ? ?HPI/Patient Profile: 61 y.o. male  with past medical history of depression, anxiety, ankle injury admitted on 10/15/2021 with 3 days of altered mental status/weakness and found by EMS to be soiled in stool/urine in recliner. Found to have sepsis, COVID, pseudomonas pneumonia with ARDS and likely underlying COPD. Hospitalization complicated further by GIB (plans for EGD), acute renal failure, failed extubation. Frail with multiple pressure ulcers present at time of admission. DNR decided 11/15/2021 (family defer decisions for his significant other). Plans for CRRT time trial of 3-4 days. Considering tracheostomy if this is thought to lead to favorable outcome.  ? ?Clinical Assessment and Goals of Care: ?I met today with "Brandon Bond" significant other, Brandon Bond, and Brandon Bond's sister after reviewing records including recent PCCM communication, labs, diagnostics, and events of hospitalization. Brandon Bond continues to be very ill and tenuous. Brandon Bond shares with me tearfully that when she was called to the hospital initially she did not believe that he would survive the day. Brandon Bond is open to interventions to give Brandon Bond the best opportunity of improvement but recognizes his poor health and significant barriers to improvement. Brandon Bond shares that she has come to the conclusion that he may not survive this illness. Brandon Bond does not want to let him go but she does not want him to suffer. She confirms desire for DNR. Would like to proceed with trial of CRRT. We will continue to discuss and reassess his progress over the coming days.  ? ?Brandon Bond shares that she and Brandon Bond have been together for > 20  years. His parents have dementia and his sister has been somewhat estranged due to mental illness burden. 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 He loved to golf. She notes his initial decline at the beginning of the pandemic when he broke his ankle in March 2020. She notes gradual decline with changes in taste and intake and gradually worsening anxiety and depression. Brandon Bond notes that he would acknowledge his illness and endorse that he needed help but would not follow through. She reports that his anxiety became so crippling that even bathing became difficult and he would describe this as painful.  ? ?We discussed the what ifs. We discussed the importance of self care. Encouraged focus on getting him through testing for GIB today and beginning dialysis and we can reassess and talk more tomorrow.  ? ?All questions/concerns addressed. Emotional support provided.  ? ?Primary Decision Maker ?NEXT OF KIN significant other Brandon Bond (parents with dementia and sister estranged and defers decisions to [place] per records) ?  ? ?SUMMARY OF RECOMMENDATIONS   ?- DNR decided ?- Watchful waiting ?- Continue interventions to allow any opportunity for improvement ? ?Code Status/Advance Care Planning: ?DNR ? ? ?Symptom Management:  ?  Per PCCM, nephrology ? ?Palliative Prophylaxis:  ?Aspiration, Bowel Regimen, Delirium Protocol, Frequent Pain Assessment, Oral Care, Palliative Wound Care, and Turn Reposition ? ?Psycho-social/Spiritual:  ?Desire for further Chaplaincy support:yes ? ?Prognosis:  ?Overall prognosis guarded.  ? ?Discharge Planning: To Be Determined  ? ?  ? ?Primary Diagnoses: ?Present on Admission: ? Sepsis with acute organ dysfunction without septic shock ([place]) ? ? ?I have reviewed the medical record, interviewed the patient and family, and examined the patient. The following aspects are pertinent. ? ?History reviewed. No pertinent past  medical history. ?Social History  ? ?Socioeconomic History  ? Marital status: Single  ?  Spouse name: Not on file  ? Number of children: Not on file  ? Years of education: Not on file  ? Highest education level: Not on file  ?Occupational History  ? Not on file  ?Tobacco Use  ? Smoking status: Unknown  ? Smokeless tobacco: Not on file  ?Substance and Sexual Activity  ? Alcohol use: Not on file  ? Drug use: Not on file  ? Sexual activity: Not on file  ?Other Topics Concern  ? Not on file  ?Social History Narrative  ? Not on file  ? ?Social Determinants of Health  ? ?Financial Resource Strain: Not on file  ?Food Insecurity: Not on file  ?Transportation Needs: Not on file  ?Physical Activity: Not on file  ?Stress: Not on file  ?Social Connections: Not on file  ? ?Family History  ?Family history unknown: Yes  ? ?Scheduled Meds: ? chlorhexidine  15 mL Mouth Rinse BID  ? chlorhexidine gluconate (MEDLINE KIT)  15 mL Mouth Rinse BID  ? Chlorhexidine Gluconate Cloth  6 each Topical Q0600  ? collagenase   Topical Daily  ? docusate sodium  100 mg Oral BID  ? feeding supplement (NEPRO CARB STEADY)  1,000 mL Per Tube Q24H  ? feeding supplement (PROSource TF)  90 mL Per Tube BID  ? fentaNYL (SUBLIMAZE) injection  50 mcg Intravenous Once  ? hydrocortisone  25 mg Rectal BID  ? mouth rinse  15 mL Mouth Rinse 10 times per day  ? pantoprazole (PROTONIX) IV  40 mg Intravenous Q12H  ? permethrin   Topical Once  ? polyethylene glycol  17 g Oral Daily  ? potassium chloride  20 mEq Per Tube Once  ? sodium chloride flush  10-40 mL Intracatheter Q12H  ? ?Continuous Infusions: ?  prismasol BGK 4/2.5    ?  prismasol BGK 4/2.5    ? sodium chloride 250 mL (10/14/21 1257)  ? sodium chloride 20 mL/hr at 11/13/2021 1219  ? dextrose 30 mL/hr at 10/21/2021 1300  ? fentaNYL infusion INTRAVENOUS 50 mcg/hr (11/09/2021 1300)  ? [START ON 11/15/2021] levofloxacin (LEVAQUIN) IV    ? levofloxacin (LEVAQUIN) IV 100 mL/hr at 11/03/2021 1300  ? norepinephrine (LEVOPHED)  Adult infusion Stopped (10/25/2021 0913)  ? prismasol BGK 4/2.5    ? ?PRN Meds:.acetaminophen (TYLENOL) oral liquid 160 mg/5 mL, fentaNYL, fentaNYL (SUBLIMAZE) injection, heparin, midazolam, midazolam, ondansetron (ZOFRAN) IV, sodium chloride, sodium chloride flush ?No Known Allergies ?Review of Systems  ?Unable to perform ROS: Intubated  ? ?Physical Exam ?Vitals and nursing note reviewed.  ?Constitutional:   ?   General: He is not in acute distress. ?   Appearance: He is cachectic. He is ill-appearing.  ?   Interventions: He is intubated.  ?Cardiovascular:  ?   Rate and Rhythm: Normal rate.  ?Pulmonary:  ?   Effort: No tachypnea or accessory muscle  usage. He is intubated.  ?Abdominal:  ?   General: Abdomen is flat.  ?Neurological:  ?   Mental Status: He is alert.  ?   Comments: Follows some commands  ? ? ?Vital Signs: BP 109/63   Pulse 90   Temp (!) 97.3 ?F (36.3 ?C) (Bladder)   Resp (!) 26   Ht $R'5\' 11"'wv$  (1.803 m)   Wt 63.5 kg   SpO2 94%   BMI 19.52 kg/m?  ?Pain Scale: CPOT ?  ?Pain Score: Asleep ? ? ?SpO2: SpO2: 94 % ?O2 Device:SpO2: 94 % ?O2 Flow Rate: .O2 Flow Rate (L/min): 15 L/min ? ?IO: Intake/output summary:  ?Intake/Output Summary (Last 24 hours) at 11/06/2021 1312 ?Last data filed at 11/11/2021 1300 ?Gross per 24 hour  ?Intake 3240.56 ml  ?Output 1670 ml  ?Net 1570.56 ml  ? ? ?LBM: Last BM Date : 11/14/2021 ?Baseline Weight: Weight: 63.5 kg ?Most recent weight: Weight: 63.5 kg     ?Palliative Assessment/Data: ? ? ? ? ?Time Total: 80 min ? ?Greater than 50%  of this time was spent counseling and coordinating care related to the above assessment and plan. ? ?Signed by: ?Vinie Sill, NP ?Palliative Medicine Team ?Pager # [phone] (M-F 8a-5p) ?Team Phone # [phone] (Nights/Weekends) ?  ? ? ? ? ? ? ? ? ? ? ? ? ?  ?

## 2021-10-18 NOTE — Progress Notes (Signed)
?[facility] ?Progress Note  ? ? ?Assessment/ Plan:   ? AKI/ azotemia: In the setting of sepsis/ multiple insults.  Making urine.  I suspect hypoperfusion/ ATN.  Likely will end up needing CRRT--> but UOP has picked up in the last few hours after albumin challnge. ?   ?            - azotemia marked- likely GI bleed/ steroids/ catabolic state ?            - start CRRT today- all 4K bath, no heparin (suspect uremic plt dysfunction) ?            - time-limited trial of 48-72 hrs is most appropriate ?            - discussed with partner at bedside 10/17/21, she is in agreement ?  ?2.  Acute hypoxic and hypercapneic respiratory failure: intubated, failed extubation 2/27.  Per PCCM.  Respiratory culture growing pseudomonas from 2/27, started on levofloxacin ?  ?3.  COVID pneumonia: s/p remdesivir, decadron, antibiotics ?  ?4.  Lower GI bleed- Gi evaluation, flex sig today unrevealing, getting tagged RBC scan today and then repeat flex sig tomorrow ?  ?5.  Dispo: ICU ? ?Subjective:   ? ?Pt in flex-sig and nuc med on my arrival to the unit today.  Events of last night noted.  CRRT orders placed earlier this AM after speaking with the team.  ? ?Objective:   ?BP 90/68   Pulse 83   Temp (!) 97.5 ?F (36.4 ?C)   Resp (!) 24   Ht _0  (1.803 m)   Wt 63.5 kg   SpO2 100%   BMI 19.52 kg/m?  ? ?Intake/Output Summary (Last 24 hours) at 10/30/2021 1557 ?Last data filed at 11/08/2021 1556 ?Gross per 24 hour  ?Intake 4080.94 ml  ?Output 3295 ml  ?Net 785.94 ml  ? ?Weight change: 0 kg ? ?Physical Exam:  Please see exam from 10/17/21 ?GEN ill-appearing ?HEENT + temporal wasting ?NECK no JVD ?PULM mech bilaterally ?CV RRR ?ABD soft ?EXT 2+ anasarca ?NEURO intubated, sedated ? ?Imaging: ?DG Chest Port 1 View ? ?Result Date: 11/14/2021 ?CLINICAL DATA:  Post central line placement EXAM: PORTABLE CHEST 1 VIEW COMPARISON:  Earlier same day FINDINGS: New left IJ double-lumen line tip is near the confluence of innominate veins.  Right PICC line, enteric tube, and endotracheal tube again identified. Persistent bilateral pulmonary opacities, right greater than left. No significant pleural effusion. No pneumothorax. Stable cardiomediastinal contours. IMPRESSION: Lines and tubes as above.  No pneumothorax.  Stable lung aeration. Electronically Signed   By: Macy Mis M.D.   On: 11/01/2021 12:13  ? ?DG Chest Port 1 View ? ?Result Date: 11/17/2021 ?CLINICAL DATA:  Acute respiratory failure. EXAM: PORTABLE CHEST 1 VIEW COMPARISON:  October 16, 2021 FINDINGS: Stable support apparatus. The endotracheal tube terminates are 6 cm above the carina. Advancement with approximately 3 cm may be considered. Normal cardiac silhouette. Stable from the immediate prior to slightly worsened aeration of the lungs with bilateral airspace consolidation worse in the lower lobes. No evidence of pneumothorax. Feeding catheter overlies the expected location of gastric body. IMPRESSION: 1. Stable from the immediate prior to slightly worsened aeration of the lungs with bilateral airspace consolidation worse in the lower lobes. 2. Stable support apparatus. Advancement with approximately 3 cm may be considered. Electronically Signed   By: Fidela Salisbury M.D.   On: 11/12/2021 11:42  ? ?DG Chest Port 1 View ? ?  Result Date: 10/17/2021 ?CLINICAL DATA:  Intubation, NG tube EXAM: PORTABLE CHEST 1 VIEW COMPARISON:  10/16/2021 FINDINGS: Endotracheal tube is 5.6 cm above the carina. Feeding tube is in place with the tip in the stomach. Right PICC line tip in the SVC. Bilateral lower lobe consolidation compatible with pneumonia, similar to prior study. No visible effusions or pneumothorax. IMPRESSION: Support devices as above. Continued bilateral lower lobe consolidation compatible with pneumonia. Electronically Signed   By: Rolm Baptise M.D.   On: 10/17/2021 00:08   ? ?Labs: ?BMET ?Recent Labs  ?Lab 10/12/21 ?1700 10/13/21 ?0231 10/14/21 ?0338 10/15/21 ?0232 10/16/21 ?8871  10/16/21 ?2241 10/17/21 ?9597 10/27/2021 ?0843  ?NA  --  137 131* 135 139 142 141 144  ?K  --  4.3 4.0 4.0 4.9 4.5 4.2 3.4*  ?CL  --  108 105 109 112* 111 112* 115*  ?CO2  --  21* 17* 17* 19* 21* 20* 23  ?GLUCOSE  --  245* 188* 76 120* 140* 116* 167*  ?BUN  --  94* 118* 139* 160* 173* 175* 167*  ?CREATININE  --  2.10* 2.33* 2.42* 3.51* 3.42* 3.33* 3.23*  ?CALCIUM  --  8.1* 7.9* 7.8* 8.4* 8.3* 8.1* 8.3*  ?PHOS 4.4 3.4 3.0 2.9 4.5  --  5.1* 5.6*  ? ?CBC ?Recent Labs  ?Lab 10/15/21 ?0615 10/15/21 ?0900 10/16/21 ?2241 10/17/21 ?4718 10/17/21 ?2325 10/27/2021 ?5501 11/11/2021 ?1500  ?WBC 13.9*   < > 36.7* 15.9* 16.2* 20.0*  --   ?NEUTROABS 13.1*  --   --   --  15.5*  --   --   ?HGB 8.6*   < > 10.6* 9.0* 6.3* 9.1* 9.4*  ?HCT 25.7*   < > 30.2* 25.5* 17.7* 26.2* 26.8*  ?MCV 88.3   < > 85.8 85.3 83.9 84.8  --   ?PLT 174   < > 294 198 152 125*  --   ? < > = values in this interval not displayed.  ? ? ?Medications:   ? ? chlorhexidine  15 mL Mouth Rinse BID  ? chlorhexidine gluconate (MEDLINE KIT)  15 mL Mouth Rinse BID  ? Chlorhexidine Gluconate Cloth  6 each Topical Q0600  ? collagenase   Topical Daily  ? docusate sodium  100 mg Oral BID  ? feeding supplement (NEPRO CARB STEADY)  1,000 mL Per Tube Q24H  ? feeding supplement (PROSource TF)  90 mL Per Tube BID  ? fentaNYL (SUBLIMAZE) injection  50 mcg Intravenous Once  ? hydrocortisone  25 mg Rectal BID  ? mouth rinse  15 mL Mouth Rinse 10 times per day  ? pantoprazole (PROTONIX) IV  40 mg Intravenous Q12H  ? permethrin   Topical Once  ? polyethylene glycol  17 g Oral Daily  ? sodium chloride flush  10-40 mL Intracatheter Q12H  ? sodium phosphate  1 enema Rectal Q8H  ? ? ? ? ?Madelon Lips, MD ?10/25/2021, 3:57 PM   ?

## 2021-10-18 NOTE — Progress Notes (Addendum)
NAME:  Brandon Bond, MRN:  [id number], DOB:  1960/09/14, LOS: 8 ADMISSION DATE:  10/06/2021, CONSULTATION DATE:  10/12/2021 REFERRING MD:  Dr. Aileen Fass, CHIEF COMPLAINT:  Acute respiratory distress    History of Present Illness:  Brandon Bond is a 61 year old male with no reported past medical history who presented to the emergency department due to altered mental status that began 3 days prior to admission.  Per report EMS found patient soiled in stool and urine in her recliner.  He was seen tachycardic and hypoxic with saturation 70% on room air.  On ED arrival patient met sepsis criteria with acute organ dysfunction.  He was also found to be COVID-positive.  Head CT negative.  CT abdomen pelvis revealed bilateral lower lobe pneumonia.  By morning of 2/23 patient was seen with progressive hypoxia and acidosis despite max BiPAP settings resulting in PCCM consult  Pertinent  Medical History  None  Significant Hospital Events: Including procedures, antibiotic start and stop dates in addition to other pertinent events   2/20 admitted with altered mental status found to be COVID-positive placed on BiPAP 2/23 failed BiPAP resulting in PCCM consult and intubation 2/24 PEEP dropped to 5 2/25 Weaned on pressure support all day, but not extubated due to poor mental status  2/26 Bright red blood rectum noted with drop in hemoglobin, bleeding scan negative 2/27 extubated -reintubated after 8h , unable to clear secretions, hypercarbic   Interim History / Subjective:   Events overnight noted. Bloody bowel movement and hemoglobin dropped to 6.3 Developed bradycardia to 40s , given epinephrine Started on Bicarbonate drip After discussion with Jenny Reichmann, DNR issued    Objective   Blood pressure (!) 126/93, pulse 88, temperature (!) 97.3 F (36.3 C), temperature source Bladder, resp. rate (!) 24, height _0  (1.803 m), weight 63.5 kg, SpO2 96 %.    Vent Mode: PRVC FiO2 (%):  [50 %] 50 % Set  Rate:  [18 bmp] 18 bmp Vt Set:  [600 mL] 600 mL PEEP:  [5 cmH20] 5 cmH20 Plateau Pressure:  [19 cmH20-24 cmH20] 20 cmH20   Intake/Output Summary (Last 24 hours) at 10/30/2021 1030 Last data filed at 10/21/2021 0900 Gross per 24 hour  Intake 2078.77 ml  Output 2020 ml  Net 58.77 ml    Filed Weights   10/16/21 0500 10/17/21 0500 11/16/2021 0500  Weight: 69.2 kg 63.5 kg 63.5 kg    Examination: General: Acute on chronic ill-appearing middle-aged male lying in bed , sedate HEENT: [place]/AT, MM pink/moist, left pupil 3 mm right 2 mm both reactive Neuro: RASS 0 to +1, awake and follows commands off sedation CV: s1s2 regular rate and rhythm, no murmur, rubs, or gallops,  PULM: No accessory muscle use, synchronous with the vent, scattered rhonchi  GI: soft, bowel sounds active in all 4 quadrants, non-tender, non-distended Extremities: warm/dry, 2+ edema  Skin: Scattered ecchymosis  Chest x-ray 2/27 independently reviewed shows ET tube in position, no change in bibasilar pneumonia, no new effusions  Labs show persistent leukocytosis, hemoglobin improved from 6.3-9.1 , mild hypokalemia, decreasing platelets, BUN remains high creatinine 2.2    Resolved Hospital Problem list    Elevated LFTs   Assessment & Plan:  Septic shock secondary to pneumonia, resolved -Respiratory culture showed Pseudomonas, pansensitive , completed course of cefepime -urine strep antigen negative Legionella negative Echo showed normal LV function, RVSP 54 P: Started on linezolid 2/28 but MRSA PCR negative, respiratory culture showing GNR, will discontinue,start Zosyn instead, expect this to  be Pseudomonas again   Acute Hypoxic and hypercapnic Respiratory Failure -Secondary to Pseudomonas pneumonia COVID-related ARDS COPD, 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 Resume SBT's VAP bundle in place  DuoNebs  Covid pneumonia -Completed remdesivir -stopped dexamethasone (D8)  -Not given baricitinib due to superimposed pneumonia -Can DC isolation precautions on day 11 of hospitalization,  Lower GI bleed, bleeding scan negative Hemoglobin has drifted down from 15 on admit to 6.3 at lowest S/p 3 U PRBC 3/1 -Continue Protonix every 12. -GI following, hemoglobin stable, has repeated bloody bowel movements,plan is for flex sig at bedside today -transfuse for hemoglobin less than 7 , 3 units being given today -Transfuse FFP for coagulopathy and give vitamin K x1  Acute metabolic encephalopathy -Likely multifactorial including sepsis and uremic -recurrent episodes of bradycardia due to coughing and gagging on vent/vagal response -Unequal pupils noted 2/27, head CT negative on admission P: Maintain neuro protective measures Aspirations precautions  PAD protocol -resume fentanyl drip, goal RASS 0 to -1, cannot use Precedex due to bradycardia,  Delirium precautions  Acute renal failure -Creatinine on admission 3.54, creatinine improved to 1.5, then worse -Azotemia could also be related to GI bleed and steroids -Good response to Lasix 2/25 but BUN rising -Third spacing P: Follow renal function  Monitor urine output Trend Bmet Avoid nephrotoxins Proceed with HD for worsening azotemia DDAVP x1 for dysfunction platelets  Critical illness polyneuromyopathy -Supportive care -Will need aggressive PT at some point  Protein calorie malnutrition - tube feeds on hold until endoscopy, D10  Multiple pressure ulcers present PTA including sacral, ischial tuberosity, vertebral column P: wound care consulted, no need for surgical debridement Optimize nutrition   Best Practice (right click and "Reselect all SmartList Selections" daily)   Diet/type: tubefeeds DVT prophylaxis: prophylactic heparin  GI prophylaxis: PPI Lines:  N/A Foley:  N/A Code Status:  full code Last date of multidisciplinary goals of care discussion: 3/1 Domestic partner Caren Griffins and sister at bedside.  DNR has been issued.  We discussed CRRT and we would like to proceed this will help.  We decided on a time-limited trial for 3 to 4 days.  They are okay with using pressors if needed.  They would be agreeable to tracheostomy if prognosis was favorable We will involve palliative care for family support  Labs   CBC: Recent Labs  Lab 10/15/21 0615 10/15/21 0900 10/16/21 0540 10/16/21 2241 10/17/21 0333 10/17/21 2325 11/12/2021 0843  WBC 13.9*  --  20.3* 36.7* 15.9* 16.2* 20.0*  NEUTROABS 13.1*  --   --   --   --  15.5*  --   HGB 8.6*   < > 9.4* 10.6* 9.0* 6.3* 9.1*  HCT 25.7*   < > 26.4* 30.2* 25.5* 17.7* 26.2*  MCV 88.3  --  83.8 85.8 85.3 83.9 84.8  PLT 174  --  221 294 198 152 125*   < > = values in this interval not displayed.     Basic Metabolic Panel: Recent Labs  Lab 10/13/21 0231 10/14/21 0338 10/15/21 0232 10/16/21 0540 10/16/21 2241 10/17/21 0333  NA 137 131* 135 139 142 141  K 4.3 4.0 4.0 4.9 4.5 4.2  CL 108 105 109 112* 111 112*  CO2 21* 17* 17* 19* 21* 20*  GLUCOSE 245* 188* 76 120* 140* 116*  BUN 94* 118*  139* 160* 173* 175*  CREATININE 2.10* 2.33* 2.42* 3.51* 3.42* 3.33*  CALCIUM 8.1* 7.9* 7.8* 8.4* 8.3* 8.1*  MG 2.3 2.1 1.9 2.2  --  1.8  PHOS 3.4 3.0 2.9 4.5  --  5.1*    GFR: Estimated Creatinine Clearance: 20.9 mL/min (A) (by C-G formula based on SCr of 3.33 mg/dL (H)). Recent Labs  Lab 10/16/21 2241 10/16/21 2243 10/17/21 0333 10/17/21 2325 11/08/2021 0049 11/06/2021 0843  PROCALCITON  --   --  2.49  --   --   --   WBC 36.7*  --  15.9* 16.2*  --  20.0*  LATICACIDVEN  --  2.2* 2.6*  --  2.5*  --      Liver Function Tests: Recent Labs  Lab 10/14/21 0338  AST 25  ALT 27  ALKPHOS 56  BILITOT 0.8  PROT 4.2*  ALBUMIN 1.9*    No results for input(s): LIPASE, AMYLASE in the last 168 hours. No  results for input(s): AMMONIA in the last 168 hours.  ABG    Component Value Date/Time   PHART 7.34 (L) 10/17/2021 0015   PCO2ART 34 10/17/2021 0015   PO2ART 88 10/17/2021 0015   HCO3 18.5 (L) 10/17/2021 0015   ACIDBASEDEF 6.6 (H) 10/17/2021 0015   O2SAT 99.4 10/17/2021 0015      Coagulation Profile: Recent Labs  Lab 10/15/21 0258  INR 1.8*     Cardiac Enzymes: No results for input(s): CKTOTAL, CKMB, CKMBINDEX, TROPONINI in the last 168 hours.   HbA1C: No results found for: HGBA1C  CBG: Recent Labs  Lab 10/17/21 1146 10/17/21 1703 10/17/21 2001 10/17/21 2240 11/01/2021 0733  GLUCAP 97 103* 101* 107* 108*     Critical care time:   CRITICAL CARE Performed by: Leanna Sato. Makael Stein  Total critical care time: 44 minutes  Critical care time was exclusive of separately billable procedures and treating other patients.  Critical care was necessary to treat or prevent imminent or life-threatening deterioration.  Critical care was time spent personally by me on the following activities: development of treatment plan with patient and/or surrogate as well as nursing, discussions with consultants, evaluation of patient's response to treatment, examination of patient, obtaining history from patient or surrogate, ordering and performing treatments and interventions, ordering and review of laboratory studies, ordering and review of radiographic studies, pulse oximetry and re-evaluation of patient's condition.    Kara Mead MD. Shade Flood.  Pulmonary & Critical care Pager : [phone]  If no response to pager , please call [phone] until 7 pm After 7:00 pm call Elink  [phone]    11/06/2021, 10:30 AM

## 2021-10-18 NOTE — Op Note (Signed)
[facility] ?Patient Name: Brandon Bond ?Procedure Date: 10/30/2021 ?MRN: [id number] ?Attending MD: Otis Brace , MD ?Date of Birth: Jun 26, 1961 ?CSN: [id number] ?Age: 61 ?Admit Type: Inpatient ?Procedure:                Flexible Sigmoidoscopy ?Indications:              Rectal hemorrhage ?Providers:                Otis Brace, MD, Ervin Knack, Janie Billups,  ?                          Technician ?Referring MD:              ?Medicines:                per ICU nurse ?Complications:            No immediate complications. ?Estimated Blood Loss:     Estimated blood loss was minimal. ?Procedure:                Pre-Anesthesia Assessment: ?                          - Prior to the procedure, a History and Physical  ?                          was performed, and patient medications and  ?                          allergies were reviewed. The patient's tolerance of  ?                          previous anesthesia was also reviewed. The risks  ?                          and benefits of the procedure and the sedation  ?                          options and risks were discussed with the patient.  ?                          All questions were answered, and informed consent  ?                          was obtained. Prior Anticoagulants: The patient has  ?                          taken no previous anticoagulant or antiplatelet  ?                          agents. ASA Grade Assessment: IV - A patient with  ?                          severe systemic disease that is a constant threat  ?                          to  life. After reviewing the risks and benefits,  ?                          the patient was deemed in satisfactory condition to  ?                          undergo the procedure. ?                          After obtaining informed consent, the scope was  ?                          passed under direct vision. The [id number]-HQ190L  ?                          [id number]) Olympus colonoscope was introduced  ?                           through the anus and advanced to the 30 cm from the  ?                          anal verge. The flexible sigmoidoscopy was  ?                          performed with difficulty due to poor bowel prep  ?                          with stool present. The patient tolerated the  ?                          procedure well. The quality of the bowel  ?                          preparation was poor. ?Scope In: 12:51:46 PM ?Scope Out: 1:02:25 PM ?Total Procedure Duration: 0 hours 10 minutes 39 seconds  ?Findings: ?     The perianal and digital rectal examinations were normal. ?     A large amount of solid stool was found in the rectum, in the  ?     recto-sigmoid colon and in the distal sigmoid colon, precluding  ?     visualization. ?     Hematin (altered blood/coffee-ground-like material) was found in the  ?     recto-sigmoid colon. ?     Retroflexion in the rectum was not performed. ?Impression:               - Preparation of the colon was poor. ?                          - Stool in the rectum, in the recto-sigmoid colon  ?                          and in the distal sigmoid colon. ?                          - Blood in the  recto-sigmoid colon. ?                          - No specimens collected. ?Moderate Sedation: ?     Moderate (conscious) sedation was ICU Nurse. The following parameters  ?     were monitored: oxygen saturation, heart rate, blood pressure, and  ?     response to care. ?Recommendation:           - Return patient to ICU for ongoing care. ?                          - Do a GI bleeding (tagged RBC) scan today. ?                          - Perform a flexible sigmoidoscopy tomorrow. ?Procedure Code(s):        --- Professional --- ?                          [id number], Sigmoidoscopy, flexible; diagnostic,  ?                          including collection of specimen(s) by brushing or  ?                          washing, when performed (separate procedure) ?Diagnosis Code(s):        --- Professional  --- ?                          K92.2, Gastrointestinal hemorrhage, unspecified ?                          K62.5, Hemorrhage of anus and rectum ?CPT copyright 2019 [employer]. All rights reserved. ?The codes documented in this report are preliminary and upon coder review may  ?be revised to meet current compliance requirements. ?Otis Brace, MD ?Otis Brace, MD ?10/28/2021 1:48:33 PM ?This report has been signed electronically. ?Number of Addenda: 0 ?

## 2021-10-18 NOTE — Progress Notes (Signed)
Pt started to decline, see flowsheet for vitals. Code cart opened. Elink MD visual in room. No official code called, meds given per MD orders( epi x1, bicarb amp x2, bicarb gtt started). MD notified pt next of kin listed. Pt is now a DNR. See MAR for additional orders.  ?

## 2021-10-18 DEATH — deceased

## 2021-10-19 ENCOUNTER — Encounter (HOSPITAL_COMMUNITY): Payer: Self-pay | Admitting: Gastroenterology

## 2021-10-19 ENCOUNTER — Encounter (HOSPITAL_COMMUNITY): Payer: Self-pay | Admitting: Anesthesiology

## 2021-10-19 ENCOUNTER — Encounter (HOSPITAL_COMMUNITY): Admission: EM | Disposition: E | Payer: Self-pay | Source: Home / Self Care | Attending: Pulmonary Disease

## 2021-10-19 ENCOUNTER — Inpatient Hospital Stay (HOSPITAL_COMMUNITY): Payer: 59

## 2021-10-19 DIAGNOSIS — N179 Acute kidney failure, unspecified: Secondary | ICD-10-CM | POA: Diagnosis not present

## 2021-10-19 DIAGNOSIS — J9601 Acute respiratory failure with hypoxia: Secondary | ICD-10-CM | POA: Diagnosis not present

## 2021-10-19 DIAGNOSIS — Z515 Encounter for palliative care: Secondary | ICD-10-CM | POA: Diagnosis not present

## 2021-10-19 DIAGNOSIS — Z7189 Other specified counseling: Secondary | ICD-10-CM | POA: Diagnosis not present

## 2021-10-19 DIAGNOSIS — J151 Pneumonia due to Pseudomonas: Secondary | ICD-10-CM | POA: Diagnosis not present

## 2021-10-19 DIAGNOSIS — A419 Sepsis, unspecified organism: Secondary | ICD-10-CM | POA: Diagnosis not present

## 2021-10-19 HISTORY — PX: FLEXIBLE SIGMOIDOSCOPY: SHX5431

## 2021-10-19 LAB — TYPE AND SCREEN
ABO/RH(D): O POS
Antibody Screen: NEGATIVE
Unit division: 0
Unit division: 0
Unit division: 0
Unit division: 0
Unit division: 0
Unit division: 0
Unit division: 0

## 2021-10-19 LAB — CBC
HCT: 25 % — ABNORMAL LOW (ref 39.0–52.0)
Hemoglobin: 8.8 g/dL — ABNORMAL LOW (ref 13.0–17.0)
MCH: 29.8 pg (ref 26.0–34.0)
MCHC: 35.2 g/dL (ref 30.0–36.0)
MCV: 84.7 fL (ref 80.0–100.0)
Platelets: 124 10*3/uL — ABNORMAL LOW (ref 150–400)
RBC: 2.95 MIL/uL — ABNORMAL LOW (ref 4.22–5.81)
RDW: 15.8 % — ABNORMAL HIGH (ref 11.5–15.5)
WBC: 17.1 10*3/uL — ABNORMAL HIGH (ref 4.0–10.5)
nRBC: 0 % (ref 0.0–0.2)

## 2021-10-19 LAB — BPAM PLATELET PHERESIS
Blood Product Expiration Date: 202303032359
Unit Type and Rh: 6200

## 2021-10-19 LAB — GLUCOSE, CAPILLARY
Glucose-Capillary: 108 mg/dL — ABNORMAL HIGH (ref 70–99)
Glucose-Capillary: 94 mg/dL (ref 70–99)
Glucose-Capillary: 90 mg/dL (ref 70–99)
Glucose-Capillary: 100 mg/dL — ABNORMAL HIGH (ref 70–99)
Glucose-Capillary: 114 mg/dL — ABNORMAL HIGH (ref 70–99)
Glucose-Capillary: 122 mg/dL — ABNORMAL HIGH (ref 70–99)

## 2021-10-19 LAB — BPAM RBC
Blood Product Expiration Date: 202304022359
Blood Product Expiration Date: 202304022359
Blood Product Expiration Date: 202304032359
Blood Product Expiration Date: 202304032359
Blood Product Expiration Date: 202304032359
Blood Product Expiration Date: 202304032359
Blood Product Expiration Date: 202304032359
ISSUE DATE / TIME: 202303010042
ISSUE DATE / TIME: 202303010323
ISSUE DATE / TIME: 202303010534
Unit Type and Rh: 5100
Unit Type and Rh: 5100
Unit Type and Rh: 5100
Unit Type and Rh: 5100
Unit Type and Rh: 5100
Unit Type and Rh: 5100
Unit Type and Rh: 5100

## 2021-10-19 LAB — PREPARE FRESH FROZEN PLASMA: Unit division: 0

## 2021-10-19 LAB — BASIC METABOLIC PANEL
Anion gap: 9 (ref 5–15)
BUN: 131 mg/dL — ABNORMAL HIGH (ref 8–23)
CO2: 25 mmol/L (ref 22–32)
Calcium: 8.1 mg/dL — ABNORMAL LOW (ref 8.9–10.3)
Chloride: 110 mmol/L (ref 98–111)
Creatinine, Ser: 2.22 mg/dL — ABNORMAL HIGH (ref 0.61–1.24)
GFR, Estimated: 33 mL/min — ABNORMAL LOW (ref >60)
Glucose, Bld: 104 mg/dL — ABNORMAL HIGH (ref 70–99)
Potassium: 3.2 mmol/L — ABNORMAL LOW (ref 3.5–5.1)
Sodium: 144 mmol/L (ref 135–145)

## 2021-10-19 LAB — PREPARE PLATELET PHERESIS
Unit division: 0
Unit division: 0
Unit division: 0

## 2021-10-19 LAB — RENAL FUNCTION PANEL
Albumin: 2.1 g/dL — ABNORMAL LOW (ref 3.5–5.0)
Anion gap: 7 (ref 5–15)
BUN: 84 mg/dL — ABNORMAL HIGH (ref 8–23)
CO2: 26 mmol/L (ref 22–32)
Calcium: 7.6 mg/dL — ABNORMAL LOW (ref 8.9–10.3)
Chloride: 106 mmol/L (ref 98–111)
Creatinine, Ser: 1.51 mg/dL — ABNORMAL HIGH (ref 0.61–1.24)
GFR, Estimated: 52 mL/min — ABNORMAL LOW (ref >60)
Glucose, Bld: 136 mg/dL — ABNORMAL HIGH (ref 70–99)
Phosphorus: 2.7 mg/dL (ref 2.5–4.6)
Potassium: 3.2 mmol/L — ABNORMAL LOW (ref 3.5–5.1)
Sodium: 139 mmol/L (ref 135–145)

## 2021-10-19 LAB — BPAM FFP
Blood Product Expiration Date: 202303031353
Blood Product Expiration Date: 202303062359
ISSUE DATE / TIME: 202303010853
ISSUE DATE / TIME: 202303011105
Unit Type and Rh: 5100
Unit Type and Rh: 5100

## 2021-10-19 LAB — CALCIUM, IONIZED: Calcium, Ionized, Serum: 5.2 mg/dL (ref 4.5–5.6)

## 2021-10-19 LAB — MAGNESIUM: Magnesium: 1.9 mg/dL (ref 1.7–2.4)

## 2021-10-19 LAB — ALBUMIN: Albumin: 2.1 g/dL — ABNORMAL LOW (ref 3.5–5.0)

## 2021-10-19 LAB — PHOSPHORUS: Phosphorus: 3.2 mg/dL (ref 2.5–4.6)

## 2021-10-19 SURGERY — SIGMOIDOSCOPY, FLEXIBLE
Anesthesia: Monitor Anesthesia Care

## 2021-10-19 MED ORDER — SODIUM CHLORIDE 0.9 % IV SOLN: INTRAVENOUS | Status: DC

## 2021-10-19 MED ORDER — JUVEN PO PACK
1.0000 | PACK | Freq: Two times a day (BID) | ORAL | Status: DC
  Administered 2021-10-19 – 2021-10-23 (×8): 1
  Filled 2021-10-19 (×8): qty 1

## 2021-10-19 MED ORDER — CHLORHEXIDINE GLUCONATE 0.12 % MT SOLN
OROMUCOSAL | Status: AC
  Filled 2021-10-19: qty 15

## 2021-10-19 MED ORDER — DOCUSATE SODIUM 50 MG/5ML PO LIQD
100.0000 mg | Freq: Two times a day (BID) | ORAL | Status: DC
  Administered 2021-10-19 – 2021-10-22 (×6): 100 mg
  Filled 2021-10-19 (×6): qty 10

## 2021-10-19 MED ORDER — FLEET ENEMA 7-19 GM/118ML RE ENEM
1.0000 | ENEMA | RECTAL | Status: AC
  Administered 2021-10-19: 1 via RECTAL

## 2021-10-19 MED ORDER — MAGNESIUM SULFATE 2 GM/50ML IV SOLN
2.0000 g | Freq: Once | INTRAVENOUS | Status: AC
  Administered 2021-10-19: 2 g via INTRAVENOUS
  Filled 2021-10-19: qty 50

## 2021-10-19 MED ORDER — VITAL 1.5 CAL PO LIQD
1000.0000 mL | ORAL | Status: DC
Start: 2021-10-19 — End: 2021-10-24
  Administered 2021-10-19 – 2021-10-23 (×5): 1000 mL
  Filled 2021-10-19 (×7): qty 1000

## 2021-10-19 MED ORDER — POLYETHYLENE GLYCOL 3350 17 G PO PACK
17.0000 g | PACK | Freq: Two times a day (BID) | ORAL | Status: DC
  Administered 2021-10-19 – 2021-10-21 (×4): 17 g
  Filled 2021-10-19 (×4): qty 1

## 2021-10-19 MED ORDER — HYDROCORTISONE 100 MG/60ML RE ENEM
100.0000 mg | ENEMA | Freq: Every day | RECTAL | Status: DC
  Administered 2021-10-19 – 2021-10-22 (×3): 100 mg via RECTAL
  Filled 2021-10-19 (×5): qty 1

## 2021-10-19 NOTE — Progress Notes (Signed)
[facility] Progress Note ? ?Brandon Bond 61 y.o. 03/30/1961 ? ?CC: Rectal bleeding ? ? ?Subjective: ?Patient seen and examined at bedside.  Off isolation now.  Discussed with significant other at bedside.  Also discussed with RN.  Had brown-colored stool with Fleet enemas. ? ?ROS : Unable to obtain ? ? ?Objective: ?Vital signs in last 24 hours: ?Vitals:  ? 04-Nov-2021 1130 11-04-21 1200  ?BP: (!) 113/59 (!) 112/59  ?Pulse: (!) 102 84  ?Resp: (!) 24 (!) 25  ?Temp: 98.8 ?F (37.1 ?C) 99 ?F (37.2 ?C)  ?SpO2: 92% 90%  ? ? ?Physical Exam: ?General -remains intubated.  Opens eye with verbal command. ?Abdomen -soft, nontender, nondistended, bowel sounds present.  No peritoneal sign ?Chest -good air entry bilaterally. ? ? ?Lab Results: ?Recent Labs  ?  10/25/2021 ?5625 11/04/2021 ?1600 11/04/21 ?0500  ?NA 144 146* 144  ?K 3.4* 3.1* 3.2*  ?CL 115* 111 110  ?CO2 23 23 25   ?GLUCOSE 167* 100* 104*  ?BUN 167* 158* 131*  ?CREATININE 3.23* 2.86* 2.22*  ?CALCIUM 8.3* 8.2* 8.1*  ?MG 1.8  --  1.9  ?PHOS 5.6* 4.9* 3.2  ? ?Recent Labs  ?  10/27/2021 ?1600 11-04-21 ?0500  ?ALBUMIN 2.3* 2.1*  ? ?Recent Labs  ?  10/17/21 ?2325 11/06/2021 ?12/18/21 10/27/2021 ?1500 2021/11/04 ?0500  ?WBC 16.2* 20.0*  --  17.1*  ?NEUTROABS 15.5*  --   --   --   ?HGB 6.3* 9.1* 9.4* 8.8*  ?HCT 17.7* 26.2* 26.8* 25.0*  ?MCV 83.9 84.8  --  84.7  ?PLT 152 125*  --  124*  ? ?No results for input(s): LABPROT, INR in the last 72 hours. ? ? ? ?Assessment/Plan: ?-Bright blood per rectum.  Most likely diverticular bleed.  Flexible sigmoidoscopy yesterday showed poor prep with solid stool but there was no evidence of hemorrhoids. ?-Acute blood loss anemia. -GI bleeding scan negative x2.  Unable to get CTA because of elevated kidney functions ?-COVID and Pseudomonas pneumonia.  Off isolation for COVID now ?-Septic shock ?-AKI ? ?Recommendations ?------------------------ ?-Plan for another bedside flexible sigmoidoscopy today ?-Other supportive care per ICU team.  Case discussed  with ICU attending Dr. 12/19/21. ?-Management plans also discussed with significant other at bedside. ? ?Risks (bleeding, infection, bowel perforation that could require surgery, sedation-related changes in cardiopulmonary systems), benefits (identification and possible treatment of source of symptoms, exclusion of certain causes of symptoms), and alternatives (watchful waiting, radiographic imaging studies, empiric medical treatment)  were explained to significant other in detail and patient wishes to proceed.  ? ? ?Vassie Loll MD, FACP ?2021-11-04, 12:45 PM ? ?Contact #  [phone]  ?

## 2021-10-19 NOTE — Op Note (Signed)
[facility] ?Patient Name: Brandon Bond ?Procedure Date: 10/18/2021 ?MRN: [id number] ?Attending MD: Otis Brace , MD ?Date of Birth: 06-08-1961 ?CSN: [id number] ?Age: 61 ?Admit Type: Inpatient ?Procedure:                Flexible Sigmoidoscopy ?Indications:              Rectal hemorrhage ?Providers:                Otis Brace, MD, William Dalton, Technician,  ?                          Ladoris Gene, RN ?Referring MD:              ?Medicines:                Sedation Administered by an ICU Nurse ?Complications:            No immediate complications. ?Estimated Blood Loss:     Estimated blood loss was minimal. ?Procedure:                Pre-Anesthesia Assessment: ?                          - Prior to the procedure, a History and Physical  ?                          was performed, and patient medications and  ?                          allergies were reviewed. The patient's tolerance of  ?                          previous anesthesia was also reviewed. The risks  ?                          and benefits of the procedure and the sedation  ?                          options and risks were discussed with the patient.  ?                          All questions were answered, and informed consent  ?                          was obtained. Prior Anticoagulants: The patient has  ?                          taken no previous anticoagulant or antiplatelet  ?                          agents. ASA Grade Assessment: IV - A patient with  ?                          severe systemic disease that is a constant threat  ?  to life. After reviewing the risks and benefits,  ?                          the patient was deemed in satisfactory condition to  ?                          undergo the procedure. ?                          After obtaining informed consent, the scope was  ?                          passed under direct vision. The [id number]-HQ190L  ?                          [id number]) Olympus  colonoscope was introduced  ?                          through the anus and advanced to the 30 cm from the  ?                          anal verge. The flexible sigmoidoscopy was  ?                          performed with moderate difficulty due to poor  ?                          bowel prep with stool present. The patient  ?                          tolerated the procedure well. The quality of the  ?                          bowel preparation was poor. ?Scope In: 1:50:41 PM ?Scope Out: 1:57:58 PM ?Total Procedure Duration: 0 hours 7 minutes 17 seconds  ?Findings: ?     The perianal and digital rectal examinations were normal. ?     A large amount of solid stool was found in the rectum, in the  ?     recto-sigmoid colon and in the distal sigmoid colon, interfering with  ?     visualization. ?     A single (solitary) fifteen mm ulcer was found in the rectum. No  ?     bleeding was present. ?Impression:               - Preparation of the colon was poor. ?                          - Stool in the rectum, in the recto-sigmoid colon  ?                          and in the distal sigmoid colon. ?                          - A single (solitary) ulcer in the rectum. ?                          -  No specimens collected. ?Moderate Sedation: ?     NA ?Recommendation:           - Return patient to ICU for ongoing care. ?                          - Resume previous diet. ?                          - Use Cortenema 1 per rectum daily for 4 weeks. ?Procedure Code(s):        --- Professional --- ?                          [phone], Sigmoidoscopy, flexible; diagnostic,  ?                          including collection of specimen(s) by brushing or  ?                          washing, when performed (separate procedure) ?Diagnosis Code(s):        --- Professional --- ?                          K62.6, Ulcer of anus and rectum ?                          K62.5, Hemorrhage of anus and rectum ?CPT copyright 2019 American Medical Association. All  rights reserved. ?The codes documented in this report are preliminary and upon coder review may  ?be revised to meet current compliance requirements. ?Otis Brace, MD ?Otis Brace, MD ?10/18/2021 2:08:34 PM ?Number of Addenda: 0 ?

## 2021-10-19 NOTE — Progress Notes (Signed)
?[facility] ?Progress Note  ? ? ?Assessment/ Plan:   ? AKI/ azotemia: In the setting of sepsis/ multiple insults.  Making urine.  I suspect hypoperfusion/ ATN.  Likely will end up needing CRRT--> but UOP has picked up in the last few hours after albumin challnge. ?   ?            - azotemia marked- likely GI bleed/ steroids/ catabolic state ?            - started CRRT 3/1 all 4K bath, no heparin (suspect uremic plt dysfunction) ?            - time-limited trial of 48-72 hrs is most appropriate ?            - discussed with partner at bedside 10/17/21, she is in agreement ?  ?2.  Acute hypoxic and hypercapneic respiratory failure: intubated, failed extubation 2/27.  Per PCCM.  Respiratory culture growing pseudomonas from 2/27, started on levofloxacin ?  ?3.  COVID pneumonia: s/p remdesivir, decadron, antibiotics ?  ?4.  Lower GI bleed- Gi evaluation, flex sig 3/1 unrevealing (poor prep), s/p tagged RBC scan 10/29/2021, repeat flex sig today- please avoid additional Na Phos enemas as can crystallize in the nephron and prolong recovery ?  ?5.  Dispo: ICU ? ?Subjective:   ? ?Seen in room.  Getting FLEETS enema on my evaluation.  CRRT ongoing- doing well.    ? ?Objective:   ?BP 108/79   Pulse 98   Temp 99 ?F (37.2 ?C)   Resp (!) 27   Ht 5' 11" (1.803 m)   Wt 63.5 kg   SpO2 90%   BMI 19.52 kg/m?  ? ?Intake/Output Summary (Last 24 hours) at 11/07/2021 1326 ?Last data filed at 10/26/2021 1300 ?Gross per 24 hour  ?Intake 3222.22 ml  ?Output 2995.9 ml  ?Net 226.32 ml  ? ?Weight change:  ? ?Physical Exam:  Please see exam from 10/17/21 ?GEN ill-appearing ?HEENT + temporal wasting ?NECK no JVD ?PULM mech bilaterally ?CV RRR ?ABD soft ?EXT 2+ anasarca ?NEURO intubated, sedated ? ?Imaging: ?NM GI Blood Loss ? ?Result Date: 11/03/2021 ?CLINICAL DATA:  Blood in bowel movements, decreasing hemoglobin EXAM: NUCLEAR MEDICINE GASTROINTESTINAL BLEEDING SCAN TECHNIQUE: Sequential abdominal images were obtained following  intravenous administration of Tc-52mlabeled red blood cells. RADIOPHARMACEUTICALS:  22.0 mCi Tc-937mertechnetate in-vitro labeled red cells. COMPARISON:  10/16/2021, 10/15/2021 FINDINGS: Anterior planar imaging of the abdomen and pelvis was performed for 2 hours after radiotracer administration. Normal physiologic distribution of radiotracer seen within the heart, vascular structures, liver, and spleen. No abnormal radiotracer accumulation to suggest active gastrointestinal bleeding. IMPRESSION: 1. No evidence of active gastrointestinal bleeding. Electronically Signed   By: MiRanda Ngo.D.   On: 11/12/2021 18:58  ? ?DG CHEST PORT 1 VIEW ? ?Result Date: 10/31/2021 ?CLINICAL DATA:  Intubated, COVID positive EXAM: PORTABLE CHEST 1 VIEW COMPARISON:  Chest radiograph 1 day prior FINDINGS: The endotracheal tube is in the upper trachea above the level of the clavicular heads approximately 10 cm from the carina. Recommend advancement by approximately 4 cm. The right upper extremity PICC is stable terminating in the mid to lower SVC. The left IJ central venous catheter is stable terminating near the confluence of the innominate veins, unchanged. The enteric catheter tip is off the field of view. The cardiomediastinal silhouette is stable. Opacities in both lower lobes, right worse than left, are again seen, overall slightly improved in the interim. Aeration of the  right upper lobe is also improved. No significant pleural effusion is seen. There is no pneumothorax. The bones are stable. IMPRESSION: 1. Endotracheal tube tip is approximately 10 cm from the carina. Recommend advancement by approximately 4 cm. 2. Overall slightly improved aeration in the lungs. These results will be called to the ordering clinician or representative by the Radiologist Assistant, and communication documented in the PACS or [employer]. Electronically Signed   By: Valetta Mole M.D.   On: 11/06/2021 08:08  ? ?DG Chest Port 1 View ? ?Result  Date: 11/08/2021 ?CLINICAL DATA:  Post central line placement EXAM: PORTABLE CHEST 1 VIEW COMPARISON:  Earlier same day FINDINGS: New left IJ double-lumen line tip is near the confluence of innominate veins. Right PICC line, enteric tube, and endotracheal tube again identified. Persistent bilateral pulmonary opacities, right greater than left. No significant pleural effusion. No pneumothorax. Stable cardiomediastinal contours. IMPRESSION: Lines and tubes as above.  No pneumothorax.  Stable lung aeration. Electronically Signed   By: Macy Mis M.D.   On: 11/04/2021 12:13  ? ?DG Chest Port 1 View ? ?Result Date: 10/21/2021 ?CLINICAL DATA:  Acute respiratory failure. EXAM: PORTABLE CHEST 1 VIEW COMPARISON:  October 16, 2021 FINDINGS: Stable support apparatus. The endotracheal tube terminates are 6 cm above the carina. Advancement with approximately 3 cm may be considered. Normal cardiac silhouette. Stable from the immediate prior to slightly worsened aeration of the lungs with bilateral airspace consolidation worse in the lower lobes. No evidence of pneumothorax. Feeding catheter overlies the expected location of gastric body. IMPRESSION: 1. Stable from the immediate prior to slightly worsened aeration of the lungs with bilateral airspace consolidation worse in the lower lobes. 2. Stable support apparatus. Advancement with approximately 3 cm may be considered. Electronically Signed   By: Fidela Salisbury M.D.   On: 11/13/2021 11:42   ? ?Labs: ?BMET ?Recent Labs  ?Lab 10/14/21 ?0338 10/15/21 ?0232 10/16/21 ?7253 10/16/21 ?2241 10/17/21 ?6644 11/14/2021 ?0347 10/27/2021 ?1600 11/02/2021 ?0500  ?NA 131* 135 139 142 141 144 146* 144  ?K 4.0 4.0 4.9 4.5 4.2 3.4* 3.1* 3.2*  ?CL 105 109 112* 111 112* 115* 111 110  ?CO2 17* 17* 19* 21* 20* _0 ?GLUCOSE 188* 76 120* 140* 116* 167* 100* 104*  ?BUN 118* 139* 160* 173* 175* 167* 158* 131*  ?CREATININE 2.33* 2.42* 3.51* 3.42* 3.33* 3.23* 2.86* 2.22*  ?CALCIUM 7.9* 7.8* 8.4*  8.3* 8.1* 8.3* 8.2* 8.1*  ?PHOS 3.0 2.9 4.5  --  5.1* 5.6* 4.9* 3.2  ? ?CBC ?Recent Labs  ?Lab 10/15/21 ?0615 10/15/21 ?0900 10/17/21 ?0333 10/17/21 ?2325 11/17/2021 ?4259 10/31/2021 ?1500 11/11/2021 ?0500  ?WBC 13.9*   < > 15.9* 16.2* 20.0*  --  17.1*  ?NEUTROABS 13.1*  --   --  15.5*  --   --   --   ?HGB 8.6*   < > 9.0* 6.3* 9.1* 9.4* 8.8*  ?HCT 25.7*   < > 25.5* 17.7* 26.2* 26.8* 25.0*  ?MCV 88.3   < > 85.3 83.9 84.8  --  84.7  ?PLT 174   < > 198 152 125*  --  124*  ? < > = values in this interval not displayed.  ? ? ?Medications:   ? ? chlorhexidine  15 mL Mouth Rinse BID  ? chlorhexidine gluconate (MEDLINE KIT)  15 mL Mouth Rinse BID  ? Chlorhexidine Gluconate Cloth  6 each Topical Q0600  ? collagenase   Topical Daily  ? docusate sodium  100 mg Oral BID  ? feeding supplement (NEPRO CARB STEADY)  1,000 mL Per Tube Q24H  ? feeding supplement (PROSource TF)  90 mL Per Tube BID  ? fentaNYL (SUBLIMAZE) injection  50 mcg Intravenous Once  ? hydrocortisone  25 mg Rectal BID  ? mouth rinse  15 mL Mouth Rinse 10 times per day  ? pantoprazole (PROTONIX) IV  40 mg Intravenous Q12H  ? permethrin   Topical Once  ? polyethylene glycol  17 g Oral Daily  ? sodium chloride flush  10-40 mL Intracatheter Q12H  ? sodium phosphate  1 enema Rectal Q8H  ? ? ? ? ?Madelon Lips, MD ?11/04/2021, 1:26 PM   ?

## 2021-10-19 NOTE — Progress Notes (Signed)
NAME:  Brandon Bond, MRN:  [id number], DOB:  10-13-1960, LOS: 9 ADMISSION DATE:  09/30/2021, CONSULTATION DATE:  10/12/2021 REFERRING MD:  Dr. Aileen Fass, CHIEF COMPLAINT:  Acute respiratory distress    History of Present Illness:  Brandon Bond is a 61 year old male with no reported past medical history who presented to the emergency department due to altered mental status that began 3 days prior to admission.  Per report EMS found patient soiled in stool and urine in her recliner.  He was seen tachycardic and hypoxic with saturation 70% on room air.  On ED arrival patient met sepsis criteria with acute organ dysfunction.  He was also found to be COVID-positive.  Head CT negative.  CT abdomen pelvis revealed bilateral lower lobe pneumonia.  By morning of 2/23 patient was seen with progressive hypoxia and acidosis despite max BiPAP settings resulting in PCCM consult  Pertinent  Medical History  None  Significant Hospital Events: Including procedures, antibiotic start and stop dates in addition to other pertinent events   2/20 admitted with altered mental status found to be COVID-positive placed on BiPAP 2/23 failed BiPAP resulting in PCCM consult and intubation 2/24 PEEP dropped to 5 2/25 Weaned on pressure support all day, but not extubated due to poor mental status  2/26 Bright red blood rectum noted with drop in hemoglobin, bleeding scan negative 2/27 extubated -reintubated after 8h , unable to clear secretions, hypercarbic 2/28 Bloody bowel movement and hemoglobin dropped to 6.3 ,Developed bradycardia to 40s , given epinephrine, DNR issued  2/28 started CRRT, flex sig -non diagnostic, bleeding scan neg  Interim History / Subjective:   Critically ill, remains on vent, low-dose fentanyl. Tolerating CRRT , urine output has tapered down after starting Low-grade brown stool documented. Afebrile   Objective   Blood pressure 137/77, pulse 90, temperature 97.9 F (36.6 C), temperature  source Bladder, resp. rate 20, height $RemoveBe'5\' 11"'VnqnSWyxj$  (1.803 m), weight 63.5 kg, SpO2 92 %.    Vent Mode: PRVC FiO2 (%):  [40 %-100 %] 40 % Set Rate:  [18 bmp] 18 bmp Vt Set:  [600 mL] 600 mL PEEP:  [5 cmH20] 5 cmH20 Plateau Pressure:  [13 cmH20-20 cmH20] 20 cmH20   Intake/Output Summary (Last 24 hours) at 11/01/2021 0906 Last data filed at 11/10/2021 0900 Gross per 24 hour  Intake 4267.93 ml  Output 2948.9 ml  Net 1319.03 ml    Filed Weights   10/16/21 0500 10/17/21 0500 10/19/2021 0500  Weight: 69.2 kg 63.5 kg 63.5 kg    Examination: General: Acute on chronic ill-appearing middle-aged male lying in bed , sedate HEENT: [place]/AT, MM pink/moist, left pupil 3 mm right 2 mm both reactive Neuro: RASS 0 to +1, eyes half open, does not follow commands today on fentanyl drip CV: s1s2 regular rate and rhythm, no murmur, rubs, or gallops,  PULM: Synchronous with vent, no accessory muscle use, scattered rhonchi  GI: soft, bowel sounds active in all 4 quadrants, non-tender, non-distended Extremities: warm/dry, 2+ edema  Skin: Scattered ecchymosis  Chest x-ray 3/2 independently reviewed shows ET tube high , bilateral lower lobe airspace disease as prior  Labs show persistent leukocytosis, hemoglobin stable at 8.8, drop in platelets, hypokalemia, magnesium 1.9, BUN and creatinine dropping with CRRT    Resolved Hospital Problem list    Elevated LFTs   Assessment & Plan:  Septic shock secondary to pneumonia, resolved -Respiratory culture showed Pseudomonas, pansensitive , completed course of cefepime x7ds , but persistent on repeat culture 2/27 -urine  strep antigen negative Legionella negative Echo showed normal LV function, RVSP 54 P: Await sensitivities, will treat with Levaquin for 7 days this time   Acute Hypoxic and hypercapnic Respiratory Failure -Secondary to Pseudomonas pneumonia COVID-related ARDS COPD, 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 Resume SBT's VAP bundle in place  DuoNebs Consider another trial of extubation after fluid removal versus early tracheostomy by Monday depending on course   Covid pneumonia -Completed remdesivir -stopped dexamethasone (D8)  -Not given baricitinib due to superimposed pneumonia -Can DC isolation precautions on day 11 of hospitalization,  Lower GI bleed, bleeding scan negative x2 Hemoglobin has drifted down from 15 on admit to 6.3 at lowest S/p 3 U PRBC 3/1 -Continue Protonix every 12. -GI following, flex sig was nondiagnostic 3/1, repeat planned today -transfuse for hemoglobin less than 7    Thrombocytopenia -DDAVP x1 given 3/1 for dysfunctional platelets -Follow  Acute metabolic encephalopathy -Likely multifactorial including sepsis and uremic -recurrent episodes of bradycardia due to coughing and gagging on vent/vagal response -Unequal pupils noted 2/27, head CT negative on admission P: Maintain neuro protective measures Aspirations precautions  PAD protocol -resume fentanyl drip, goal RASS 0 to -1, cannot use Precedex due to bradycardia,  Delirium precautions  Acute renal failure -Creatinine on admission 3.54, creatinine improved to 1.5, then worse -Azotemia could also be related to GI bleed and steroids -Good response to Lasix + albumin -Third spacing -on CRRT since 3/1 P: Follow renal function, urine output and BMET Avoid nephrotoxins Trying to keep -100 -200 cc on CRRT Hypokalemia repleted   Critical illness polyneuromyopathy -Supportive care -Will need aggressive PT at some point  Protein calorie malnutrition -Hope to restart tube feeds after endoscopy today -ct D10  Multiple pressure ulcers present PTA including sacral, ischial tuberosity, vertebral column P: wound care consulted, no need for surgical debridement Optimize  nutrition   Best Practice (right click and "Reselect all SmartList Selections" daily)   Diet/type: tubefeeds DVT prophylaxis: prophylactic heparin  GI prophylaxis: PPI Lines: N/A Foley:  N/A Code Status:  full code Last date of multidisciplinary goals of care discussion: 3/1 Domestic partner Caren Griffins and sister at bedside.  DNR has been issued.  CRRT will be used for limited time to enable fluid removal and facilitate extubation.  Okay for pressors if needed.  They would be agreeable to tracheostomy if prognosis was favorable Appreciate palliative care for family support .  Sister defers to [facility] for healthcare decision making  Labs   CBC: Recent Labs  Lab 10/15/21 0615 10/15/21 0900 10/16/21 2241 10/17/21 0333 10/17/21 2325 11/13/2021 0843 11/11/2021 1500 10/21/2021 0500  WBC 13.9*   < > 36.7* 15.9* 16.2* 20.0*  --  17.1*  NEUTROABS 13.1*  --   --   --  15.5*  --   --   --   HGB 8.6*   < > 10.6* 9.0* 6.3* 9.1* 9.4* 8.8*  HCT 25.7*   < > 30.2* 25.5* 17.7* 26.2* 26.8* 25.0*  MCV 88.3   < > 85.8 85.3 83.9 84.8  --  84.7  PLT 174   < > 294 198 152 125*  --  124*   < > = values in this interval not displayed.     Basic Metabolic Panel: Recent Labs  Lab 10/15/21 0232 10/16/21 0540 10/16/21 2241 10/17/21 7322 11/14/2021 0254  11/17/2021 1600 11/02/2021 0500  NA 135 139 142 141 144 146* 144  K 4.0 4.9 4.5 4.2 3.4* 3.1* 3.2*  CL 109 112* 111 112* 115* 111 110  CO2 17* 19* 21* 20* $Remov'23 23 25  'lrzDCI$ GLUCOSE 76 120* 140* 116* 167* 100* 104*  BUN 139* 160* 173* 175* 167* 158* 131*  CREATININE 2.42* 3.51* 3.42* 3.33* 3.23* 2.86* 2.22*  CALCIUM 7.8* 8.4* 8.3* 8.1* 8.3* 8.2* 8.1*  MG 1.9 2.2  --  1.8 1.8  --  1.9  PHOS 2.9 4.5  --  5.1* 5.6* 4.9* 3.2    GFR: Estimated Creatinine Clearance: 31.4 mL/min (A) (by C-G formula based on SCr of 2.22 mg/dL (H)). Recent Labs  Lab 10/16/21 2243 10/17/21 0333 10/17/21 2325 11/12/2021 0049 10/20/2021 0843 10/25/2021 1126 11/01/2021 0500  PROCALCITON  --   2.49  --   --   --   --   --   WBC  --  15.9* 16.2*  --  20.0*  --  17.1*  LATICACIDVEN 2.2* 2.6*  --  2.5*  --  2.5*  --      Liver Function Tests: Recent Labs  Lab 10/14/21 0338 10/21/2021 1600 10/22/2021 0500  AST 25  --   --   ALT 27  --   --   ALKPHOS 56  --   --   BILITOT 0.8  --   --   PROT 4.2*  --   --   ALBUMIN 1.9* 2.3* 2.1*    No results for input(s): LIPASE, AMYLASE in the last 168 hours. No results for input(s): AMMONIA in the last 168 hours.  ABG    Component Value Date/Time   PHART 7.34 (L) 10/17/2021 0015   PCO2ART 34 10/17/2021 0015   PO2ART 88 10/17/2021 0015   HCO3 18.5 (L) 10/17/2021 0015   ACIDBASEDEF 6.6 (H) 10/17/2021 0015   O2SAT 99.4 10/17/2021 0015      Coagulation Profile: Recent Labs  Lab 10/15/21 0258  INR 1.8*     Cardiac Enzymes: No results for input(s): CKTOTAL, CKMB, CKMBINDEX, TROPONINI in the last 168 hours.   HbA1C: No results found for: HGBA1C  CBG: Recent Labs  Lab 10/21/2021 1526 10/22/2021 2022 10/22/2021 2302 11/15/2021 0425 10/22/2021 0758  GLUCAP 99 116* 139* 108* 94     Critical care time:   CRITICAL CARE Performed by: Leanna Sato. Newell Frater  Total critical care time: 42 minutes  Critical care time was exclusive of separately billable procedures and treating other patients.  Critical care was necessary to treat or prevent imminent or life-threatening deterioration.  Critical care was time spent personally by me on the following activities: development of treatment plan with patient and/or surrogate as well as nursing, discussions with consultants, evaluation of patient's response to treatment, examination of patient, obtaining history from patient or surrogate, ordering and performing treatments and interventions, ordering and review of laboratory studies, ordering and review of radiographic studies, pulse oximetry and re-evaluation of patient's condition.    Kara Mead MD. Shade Flood. [facility] Pager : [phone]  If no response to pager , please call [phone] until 7 pm After 7:00 pm call Elink  [phone]    11/02/2021, 9:06 AM

## 2021-10-19 NOTE — Brief Op Note (Signed)
10/13/2021 - 10/27/2021 ? ?2:09 PM ? ?PATIENT:  Brandon Bond  61 y.o. male ? ?PRE-OPERATIVE DIAGNOSIS:  anemia, hematochezia ? ?POST-OPERATIVE DIAGNOSIS:  see MD note ? ?PROCEDURE:  Procedure(s): ?FLEXIBLE SIGMOIDOSCOPY (N/A) ? ?SURGEON:  Surgeon(s) and Role: ?   * Kathi Der, MD - Primary ? ?Findings ?---------- ?-Flexible sigmoidoscopy showed large amount of retained stool ulcer in the rectum.  Ulcer was around 15 mm in size and clean-based without any evidence of active bleeding. ? ?Recommendations ?----------------------- ?-Start Cortenema ?-Okay to resume tube feedings from GI standpoint ?-Patient was not adequately prepped despite of giving 3 to 4 fleets enema, if he has recurrent lower GI bleed he will probably need prep through OG or NG tube for better visualization of colonic mucosa. ?-Monitor H&H ?-GI will follow periodically ? ?Kathi Der MD, FACP ?11/05/2021, 2:10 PM ? ?Contact #  [phone]  ?

## 2021-10-19 NOTE — TOC Progression Note (Signed)
Transition of Care (TOC) - Progression Note  ? ? ?Patient Details  ?Name: Brandon Bond ?MRN: [id number] ?Date of Birth: 1961/03/31 ? ?Transition of Care (TOC) CM/SW Contact  ?Golda Acre, RN ?Phone Number: ?10/29/2021, 8:22 AM ? ?Clinical Narrative:    ?Remains on the vent at 40% fi02.  Following for toc needs.  [facility] has been contact and an case is open. ? ? ?Expected Discharge Plan: Home/Self Care ?Barriers to Discharge: Continued Medical Work up ? ?Expected Discharge Plan and Services ?Expected Discharge Plan: Home/Self Care ?  ?Discharge Planning Services: CM Consult ?  ?Living arrangements for the past 2 months: Single Family Home ?                ?  ?  ?  ?  ?  ?  ?  ?  ?  ?  ? ? ?Social Determinants of Health (SDOH) Interventions ?  ? ?Readmission Risk Interventions ?No flowsheet data found. ? ?

## 2021-10-19 NOTE — Progress Notes (Signed)
Nutrition Follow-up ? ?DOCUMENTATION CODES:  ? ?Severe malnutrition in context of chronic illness, Underweight ? ?INTERVENTION:  ?- once TF able to be resumed: Vital 1.5 @ 25 ml/hr to increase by 10 ml every 24 hours to reach goal rate of 55 ml/hr with 90 ml Prosource TF BID and 1 packet Juven BID. ?- at goal rate this regimen will provide 2330 kcal, 138 grams protein, and 1006 ml free water. ? ? ?NUTRITION DIAGNOSIS:  ? ?Severe Malnutrition related to chronic illness as evidenced by severe fat depletion, severe muscle depletion. -ongoing ? ?GOAL:  ? ?Patient will meet greater than or equal to 90% of their needs -to be met with TF at goal rate. ? ?MONITOR:  ? ?Vent status, TF tolerance, Labs, Weight trends, Skin ? ? ?ASSESSMENT:  ? ?61 year old white male with no medical history in our EMR. He presented to the ED due to AMS and weakness x2-3 days. EMS reported that patient was found in a recliner soiled with urine and stool. In the ED, CT head was negative for acute findings and CT chest/abdomen/pelvis showed bilateral lower lobe PNA. ? ?Significant Events: ?2/20- admission ?2/23- intubation; OGT placement; initial RD assessment; initiation of TF ?2/27- extubation; small bore NGT placement (gastric per abdominal x-ray); re-intubated by Anesthesia  ?3/1- CRRT initiation; flex sig ? ? ?Able to talk with RN. Plan for repeat flex sig at bedside today. About to start shortly after RD visit.  ? ?Tube feeding has been on hold due to flex sig yesterday and repeat today. Plan is for TF to resume after flex sig today. ? ?Patient remains intubated with small bore NGT in place. His girlfriend was at bedside at the time of RD visit. ? ?Updated estimated nutrition needs. Used weight from 2/22 (54.8 kg) to re-estimate needs due to moderate pitting edema to BUE and perineal area and deep pitting edema to BLE.  ? ? ?Patient is currently intubated on ventilator support ?MV: 16.3 L/min ?Temp (24hrs), Avg:98.5 ?F (36.9 ?C), Min:97.3 ?F  (36.3 ?C), Max:99.1 ?F (37.3 ?C) ?Propofol: none ?BP: 108/79 and MAP: 86 ? ?Labs reviewed; CBGs: 108, 94, 90 mg/dl, K: 3.2 mmol/l, BUN: 131 mg/dl, creatinine: 2.22 mg/dl, Ca: 8.1 mg/dl, GFR: 33 ml/min. ? ?Medications reviewed; 40 mg IV protonix BID, 17 g miralax/day, 20 mEq Klor-Con x1 dose 3/1. ? ?Drip; fentanyl @ 75 mcg/hr. ? ?IVF; D10 @ 30 ml/hr (245 kcal/24 hrs). ? ? ?Diet Order:   ?Diet Order   ? ?       ?  Diet NPO time specified  Diet effective now       ?  ? ?  ?  ? ?  ? ? ?EDUCATION NEEDS:  ? ?No education needs have been identified at this time ? ?Skin:  Skin Assessment: Skin Integrity Issues: ?Skin Integrity Issues:: Stage II, Unstageable, DTI ?DTI: scrotum ?Stage II: R IT; vertebral column ?Unstageable: full thickness to sacrum, L IT, and vertebral column ? ?Last BM:  3/1 (type 7, medium amount); 3/2 (type 6, smear) ? ?Height:  ? ?Ht Readings from Last 1 Encounters:  ?10/29/2021 _0  (1.803 m)  ? ? ?Weight:  ? ?Wt Readings from Last 1 Encounters:  ?11/12/2021 63.5 kg  ? ? ? ?BMI:  Body mass index is 19.52 kg/m?. ? ?Estimated Nutritional Needs:  ?Kcal:  2100-2300 kcal ?Protein:  >/= 135 grams ?Fluid:  >/= 2.2 L/day ? ? ? ? ?Jarome Matin, MS, RD, LDN ?Inpatient Clinical Dietitian ?RD pager # available in [place]  ?  After hours/weekend pager # available in [place] ? ?

## 2021-10-19 NOTE — Progress Notes (Signed)
Physical Therapy Discharge ?Patient Details ?Name: Brandon Bond ?MRN: [id number] ?DOB: [birth year] ?Today's Date: 10/31/2021 ?Time:  -  ?  ? ?Patient discharged from PT services secondary to medical decline - will need to re-order PT to resume therapy services. ? ?   ? ?Progress and discharge plan discussed with patient and/or caregiver: Patient unable to participate in discharge planning and no caregivers available ?Tresa Endo PT ?Acute Rehabilitation Services ?Pager [phone] ?Office [phone] ? ? ?GP ?   ? ?[name] ?10/28/2021, 6:50 AM  ?

## 2021-10-19 NOTE — Progress Notes (Signed)
Called by RN that ETT residing at 19 Lip and VT low MVe. RT deflated cuff advanced ETT to 24 cm lip (as at intubation). PT tolerated well, no signs of leak, MVe appropriate (full VT received and larger VT being returned). Able to decrease Fi02 to 40%. RT asked RN to order CXR to verify tube placement is appropriate. RT will attempt SBT once tube placement is checked and RT available for return visit (RN aware). ?

## 2021-10-19 NOTE — Progress Notes (Signed)
eLink Physician-Brief Progress Note ?Patient Name: Brandon Bond ?DOB: 08-26-1960 ?MRN: [id number] ? ? ?Date of Service ? 10/29/2021  ?HPI/Events of Note ? Notified of K 3.2 and Mag 1.2 ?Patient on CRRT with 4K bath  ?eICU Interventions ? Replete Mag ?  ? ? ? ?  ? ?[name] Mechele Collin ?10/25/2021, 6:49 AM ?

## 2021-10-19 NOTE — Progress Notes (Signed)
Palliative: ? ?HPI: 61 y.o. male  with past medical history of depression, anxiety, ankle injury admitted on 10/04/2021 with 3 days of altered mental status/weakness and found by EMS to be soiled in stool/urine in recliner. Found to have sepsis, COVID, pseudomonas pneumonia with ARDS and likely underlying COPD. Hospitalization complicated further by GIB (plans for EGD), acute renal failure, failed extubation. Frail with multiple pressure ulcers present at time of admission. DNR decided 10/29/2021 (family defer decisions for his significant other). Plans for CRRT time trial of 3-4 days. Considering tracheostomy if this is thought to lead to favorable outcome.   ? ?I met today at "Spencer's" bedside along with significant other, Caren Griffins. He has eyes open but is not interacting as much with me but family and RN note that he follows commands. He is tolerating CRRT. No further signs of bleeding since yesterday. Hgb maintaining > 8 right now. No evidence of bleeding on bleeding scan yesterday and plans for repeat flex sig this afternoon. Plans to get him through CRRT and flex sig today and allow him to rest on vent. Plans to follow over the next few days. Caren Griffins wishes to see how he progresses. She wants to provide any interventions that give him any hope of returning to some quality of life. She is also trying to find peace with knowledge that his life may be very limited. Will need to consider their wishes for tracheostomy in the coming days - family wish to follow his progress in the meantime before making this decision.  ? ?All questions/concerns addressed. Emotional support provided.  ? ?Exam: Thin, frail, cachectic. ETT to vent 40% FiO2 - tolerating with no dyssynchrony. No distress. Eyes open - less interactive to me but nursing/family report that he has been following simple commands. Abd soft.  ? ?Plan: ?- Continue trial of full scope treatment.  ?- DNR continues.  ?- Ongoing conversation regarding potential  tracheostomy.  ? ?25 min ? ?Vinie Sill, NP ?Palliative Medicine Team ?Pager [phone] (Please see [email or web address] for schedule) ?Team Phone [phone]  ? ? ?Greater than 50%  of this time was spent counseling and coordinating care related to the above assessment and plan   ?

## 2021-10-20 DIAGNOSIS — U071 COVID-19: Secondary | ICD-10-CM | POA: Diagnosis not present

## 2021-10-20 DIAGNOSIS — A419 Sepsis, unspecified organism: Secondary | ICD-10-CM | POA: Diagnosis not present

## 2021-10-20 DIAGNOSIS — N179 Acute kidney failure, unspecified: Secondary | ICD-10-CM | POA: Diagnosis not present

## 2021-10-20 DIAGNOSIS — J9601 Acute respiratory failure with hypoxia: Secondary | ICD-10-CM | POA: Diagnosis not present

## 2021-10-20 DIAGNOSIS — E43 Unspecified severe protein-calorie malnutrition: Secondary | ICD-10-CM

## 2021-10-20 LAB — RENAL FUNCTION PANEL
Albumin: 2.1 g/dL — ABNORMAL LOW (ref 3.5–5.0)
Albumin: 2.2 g/dL — ABNORMAL LOW (ref 3.5–5.0)
Anion gap: 10 (ref 5–15)
Anion gap: 7 (ref 5–15)
BUN: 67 mg/dL — ABNORMAL HIGH (ref 8–23)
BUN: 64 mg/dL — ABNORMAL HIGH (ref 8–23)
CO2: 26 mmol/L (ref 22–32)
CO2: 26 mmol/L (ref 22–32)
Calcium: 8 mg/dL — ABNORMAL LOW (ref 8.9–10.3)
Calcium: 8 mg/dL — ABNORMAL LOW (ref 8.9–10.3)
Chloride: 104 mmol/L (ref 98–111)
Chloride: 104 mmol/L (ref 98–111)
Creatinine, Ser: 1.13 mg/dL (ref 0.61–1.24)
Creatinine, Ser: 1.19 mg/dL (ref 0.61–1.24)
GFR, Estimated: >60 mL/min (ref >60)
GFR, Estimated: >60 mL/min (ref >60)
Glucose, Bld: 132 mg/dL — ABNORMAL HIGH (ref 70–99)
Glucose, Bld: 118 mg/dL — ABNORMAL HIGH (ref 70–99)
Phosphorus: 2.5 mg/dL (ref 2.5–4.6)
Phosphorus: 2.1 mg/dL — ABNORMAL LOW (ref 2.5–4.6)
Potassium: 3.7 mmol/L (ref 3.5–5.1)
Potassium: 3.8 mmol/L (ref 3.5–5.1)
Sodium: 140 mmol/L (ref 135–145)
Sodium: 137 mmol/L (ref 135–145)

## 2021-10-20 LAB — CBC
HCT: 25.1 % — ABNORMAL LOW (ref 39.0–52.0)
HCT: 24.4 % — ABNORMAL LOW (ref 39.0–52.0)
Hemoglobin: 8.5 g/dL — ABNORMAL LOW (ref 13.0–17.0)
Hemoglobin: 8.3 g/dL — ABNORMAL LOW (ref 13.0–17.0)
MCH: 29.5 pg (ref 26.0–34.0)
MCH: 29.7 pg (ref 26.0–34.0)
MCHC: 33.9 g/dL (ref 30.0–36.0)
MCHC: 34 g/dL (ref 30.0–36.0)
MCV: 87.2 fL (ref 80.0–100.0)
MCV: 87.5 fL (ref 80.0–100.0)
Platelets: 122 10*3/uL — ABNORMAL LOW (ref 150–400)
Platelets: 127 10*3/uL — ABNORMAL LOW (ref 150–400)
RBC: 2.88 MIL/uL — ABNORMAL LOW (ref 4.22–5.81)
RBC: 2.79 MIL/uL — ABNORMAL LOW (ref 4.22–5.81)
RDW: 15.9 % — ABNORMAL HIGH (ref 11.5–15.5)
RDW: 16.1 % — ABNORMAL HIGH (ref 11.5–15.5)
WBC: 16.2 10*3/uL — ABNORMAL HIGH (ref 4.0–10.5)
WBC: 18.4 10*3/uL — ABNORMAL HIGH (ref 4.0–10.5)
nRBC: 0 % (ref 0.0–0.2)
nRBC: 0 % (ref 0.0–0.2)

## 2021-10-20 LAB — GLUCOSE, CAPILLARY
Glucose-Capillary: 103 mg/dL — ABNORMAL HIGH (ref 70–99)
Glucose-Capillary: 108 mg/dL — ABNORMAL HIGH (ref 70–99)
Glucose-Capillary: 109 mg/dL — ABNORMAL HIGH (ref 70–99)
Glucose-Capillary: 112 mg/dL — ABNORMAL HIGH (ref 70–99)
Glucose-Capillary: 124 mg/dL — ABNORMAL HIGH (ref 70–99)
Glucose-Capillary: 136 mg/dL — ABNORMAL HIGH (ref 70–99)

## 2021-10-20 LAB — MAGNESIUM: Magnesium: 2.4 mg/dL (ref 1.7–2.4)

## 2021-10-20 MED ORDER — ACETYLCYSTEINE 20 % IN SOLN: 2.0000 mL | Freq: Four times a day (QID) | RESPIRATORY_TRACT | Status: DC

## 2021-10-20 MED ORDER — SODIUM CHLORIDE 0.9 % IV BOLUS
500.0000 mL | Freq: Once | INTRAVENOUS | Status: AC
  Administered 2021-10-20: 500 mL via INTRAVENOUS

## 2021-10-20 MED ORDER — ACETYLCYSTEINE 10% NICU INHALATION SOLUTION
2.0000 mL | Freq: Four times a day (QID) | RESPIRATORY_TRACT | Status: DC
Start: 2021-10-20 — End: 2021-10-20
  Filled 2021-10-20 (×2): qty 2

## 2021-10-20 MED ORDER — ALBUTEROL SULFATE (2.5 MG/3ML) 0.083% IN NEBU
2.5000 mg | INHALATION_SOLUTION | Freq: Four times a day (QID) | RESPIRATORY_TRACT | Status: DC
  Administered 2021-10-20 – 2021-10-23 (×14): 2.5 mg via RESPIRATORY_TRACT
  Filled 2021-10-20 (×14): qty 3

## 2021-10-20 MED ORDER — MELATONIN 5 MG PO TABS
5.0000 mg | ORAL_TABLET | Freq: Once | ORAL | Status: AC
  Administered 2021-10-20: 5 mg
  Filled 2021-10-20: qty 1

## 2021-10-20 MED ORDER — ACETYLCYSTEINE 20 % IN SOLN
2.0000 mL | Freq: Four times a day (QID) | RESPIRATORY_TRACT | Status: DC
  Administered 2021-10-20 – 2021-10-23 (×14): 2 mL via RESPIRATORY_TRACT
  Filled 2021-10-20 (×12): qty 4

## 2021-10-20 MED ORDER — FENTANYL BOLUS VIA INFUSION
25.0000 ug | INTRAVENOUS | Status: DC | PRN
  Administered 2021-10-20 – 2021-10-23 (×6): 50 ug via INTRAVENOUS
  Filled 2021-10-20: qty 50

## 2021-10-20 MED ORDER — GABAPENTIN 250 MG/5ML PO SOLN
400.0000 mg | Freq: Three times a day (TID) | ORAL | Status: AC
  Administered 2021-10-20 – 2021-10-21 (×3): 400 mg
  Filled 2021-10-20 (×3): qty 8

## 2021-10-20 NOTE — Progress Notes (Signed)
eLink Physician-Brief Progress Note ?Patient Name: Brandon Bond ?DOB: 22-Jan-1961 ?MRN: [id number] ? ? ?Date of Service ? 10/20/2021  ?HPI/Events of Note ? Called re: need for NGT sedation d/t IV sedation with Versed causing BP drops   ?eICU Interventions ? - check cortisol (recently stopped Dexamethasone)  ?- check CBC (recent GI bleed)  ?- will order PO Gabapentin   ? ? ? ?  ? ?Idell Pickles Antwone Capozzoli ?10/20/2021, 7:52 PM ?

## 2021-10-20 NOTE — Progress Notes (Signed)
Pharmacy Antibiotic Note ? ?Brandon Bond is a 61 y.o. male admitted on 09/30/2021. Patient with Covid pneumonia s/p treatment as well as pneumonia s/t pseudomonas initially treated with cefepime. Patient extubated 2/27, but required reintubation later that day due to secretions and weakness.  Pharmacy has been consulted for levofloxacin dosing. ? ?Today, 10/20/21 ?- S/p flex sig which found non bleeding ulcer ?- Remains on the ventilator ?- Continues on CRRT (started 3/1) ?- Respiratory culture with pseudomonas (R ceftazidime) ? ?Plan: ?- Continue levofloxacin 500 mg IV q24h for CRRT ?- Plan for 7 days of treatment per CCMD ? ?Height: 5\' 11"  (180.3 cm) ?Weight: 58.4 kg (128 lb 12 oz) ?IBW/kg (Calculated) : 75.3 ? ?Temp (24hrs), Avg:99 ?F (37.2 ?C), Min:98.6 ?F (37 ?C), Max:99.3 ?F (37.4 ?C) ? ?Recent Labs  ?Lab 10/16/21 ?2243 10/17/21 ?0333 10/17/21 ?2325 10/30/2021 ?[id number] 11/10/2021 ?[id number] 11/11/2021 ?1126 11/14/2021 ?1600 10/28/2021 ?0500 11/16/2021 ?1601 10/20/21 ?0421  ?WBC  --  15.9* 16.2*  --  20.0*  --   --  17.1*  --  16.2*  ?CREATININE  --  3.33*  --   --  3.23*  --  2.86* 2.22* 1.51* 1.13  ?LATICACIDVEN 2.2* 2.6*  --  2.5*  --  2.5*  --   --   --   --   ? ?  ?Estimated Creatinine Clearance: 56.7 mL/min (by C-G formula based on SCr of 1.13 mg/dL).   ? ?No Known Allergies ? ?Antimicrobials this admission: ?3/1 levofloxacin >> ?2/27 linezolid x 1 ?2/20 vanc >> 2/21 ?2/20 cefepime >> 2/28 ?2/20 flagyl x 1 ?2/21 RDV >> 2/25 ? ?Microbiology results: ?2/27 RCx: PsA (R ceftazidime) ?2/24 RCx: PsA (pansens) ?2/20 BCx: ngF ?2/20 UCx: ngF ? ?Thank you for allowing pharmacy to be a part of this patient?s care. ? ?Tawnya Crook, PharmD, BCPS ?Clinical Pharmacist ?10/20/2021 11:47 AM ? ? ?

## 2021-10-20 NOTE — Progress Notes (Signed)
NAME:  Brandon Bond, MRN:  [id number], DOB:  May 29, 1961, LOS: 57 ADMISSION DATE:  09/29/2021, CONSULTATION DATE:  10/12/2021 REFERRING MD:  Dr. Aileen Fass, CHIEF COMPLAINT:  Acute respiratory distress    History of Present Illness:  Brandon Bond is a [age]ow[age] never vax for covid with no reported past medical history who presented to the emergency department due to altered mental status that began 3 days prior to admission.  Per report EMS found patient soiled in stool and urine in her recliner.  He was seen tachycardic and hypoxic with saturation 70% on room air.  On ED arrival patient met sepsis criteria with acute organ dysfunction.  He was also found to be COVID-positive.  Head CT negative.  CT abdomen pelvis revealed bilateral lower lobe pneumonia.  By morning of 2/23 patient was seen with progressive hypoxia and acidosis despite max BiPAP settings resulting in PCCM consult  Pertinent  Medical History  None  Significant Hospital Events: Including procedures, antibiotic start and stop dates in addition to other pertinent events   2/20 admitted with altered mental status found to be COVID-positive placed on BiPAP 2/23 failed BiPAP resulting in PCCM consult and intubation 2/24 PEEP dropped to 5 2/25 Weaned on pressure support all day, but not extubated due to poor mental status  2/26 Bright red blood rectum noted with drop in hemoglobin, bleeding scan negative 2/27 extubated -reintubated after 8h , unable to clear secretions, hypercarbic 2/28 Bloody bowel movement and hemoglobin dropped to 6.3 ,Developed bradycardia to 40s , given epinephrine, DNR issued  2/28 started CRRT, flex sig -non diagnostic, bleeding scan neg 3/1 and 3/2 flex sig > rectal ulcer, not bleeding    Scheduled Meds:  chlorhexidine  15 mL Mouth Rinse BID   chlorhexidine gluconate (MEDLINE KIT)  15 mL Mouth Rinse BID   Chlorhexidine Gluconate Cloth  6 each Topical Q0600   collagenase   Topical Daily   docusate  100 mg  Per Tube BID   feeding supplement (PROSource TF)  90 mL Per Tube BID   fentaNYL (SUBLIMAZE) injection  50 mcg Intravenous Once   hydrocortisone  100 mg Rectal QHS   mouth rinse  15 mL Mouth Rinse 10 times per day   nutrition supplement (JUVEN)  1 packet Per Tube BID BM   pantoprazole (PROTONIX) IV  40 mg Intravenous Q12H   permethrin   Topical Once   polyethylene glycol  17 g Per Tube BID   sodium chloride flush  10-40 mL Intracatheter Q12H   Continuous Infusions:   prismasol BGK 4/2.5 500 mL/hr at 10/20/21 0452    prismasol BGK 4/2.5 400 mL/hr at 11/12/2021 2353   sodium chloride 5 mL/hr at 10/20/21 0900   feeding supplement (VITAL 1.5 CAL) 1,000 mL (11/03/2021 1600)   fentaNYL infusion INTRAVENOUS 75 mcg/hr (10/20/21 0900)   levofloxacin (LEVAQUIN) IV Stopped (11/05/2021 1519)   norepinephrine (LEVOPHED) Adult infusion Stopped (11/02/2021 0913)   prismasol BGK 4/2.5 1,000 mL/hr at 10/20/21 0453   PRN Meds:.acetaminophen (TYLENOL) oral liquid 160 mg/5 mL, fentaNYL, fentaNYL (SUBLIMAZE) injection, heparin, midazolam, midazolam, ondansetron (ZOFRAN) IV, sodium chloride, sodium chloride flush    Interim History / Subjective:  Presently on PSV tol well, follows commands on fent drip but appears very weak    Objective   Blood pressure 111/63, pulse 76, temperature 99.3 F (37.4 C), resp. rate (!) 21, height $RemoveBe'5\' 11"'[id number]$  (1.803 m), weight 58.4 kg, SpO2 94 %.    Vent Mode: CPAP;PSV FiO2 (%):  [  40 %-60 %] 40 % Set Rate:  [18 bmp] 18 bmp Vt Set:  [600 mL] 600 mL PEEP:  [5 cmH20] 5 cmH20 Pressure Support:  [10 cmH20] 10 cmH20 Plateau Pressure:  [12 cmH20-23 cmH20] 12 cmH20   Intake/Output Summary (Last 24 hours) at 10/20/2021 0915 Last data filed at 10/20/2021 0900 Gross per 24 hour  Intake 1941.89 ml  Output 4492 ml  Net -2550.11 ml   Filed Weights   10/17/21 0500 11/02/2021 0500 10/20/21 0435  Weight: 63.5 kg 63.5 kg 58.4 kg    Examination: Tmax 99.3 General appearance:    acute and  chronically ill appearing     No jvd Oropharynx et/ Feeding tube nasally Neck supple Lungs with a few scattered exp > insp rhonchi bilaterally RRR no s3 or or sign murmur Abd soft / tol tf ok   Extr warm with no edema or clubbing noted Neuro  Sensorium f/c,  no apparent motor deficits though can't lift neck  much  at all off pillow       Resolved Hospital Problem list    Elevated LFTs   Assessment & Plan:  Septic shock secondary to pneumonia, resolved -Respiratory culture showed Pseudomonas, pansensitive , completed course of cefepime x7ds , but persistent on repeat culture 2/27 -urine strep antigen negative Legionella negative Echo showed normal LV function, RVSP 54 P: Await sensitivities, will treat with Levaquin @  3/1 >>> 7 days planned    Acute Hypoxic and hypercapnic Respiratory Failure -Secondary to Pseudomonas pneumonia COVID-related ARDS COPD, 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  VAP bundle in place  DuoNebs Wean sedation and continue PSV trials as tol    Covid pneumonia -Completed remdesivir -stopped dexamethasone (pD8)  -Not given baricitinib due to superimposed pneumonia - DC' d  isolation precautions on day 11 of hospitalization,  Lower GI bleed, bleeding scan negative x2 Hemoglobin has drifted down from 15 on admit to 6.3 at lowest S/p 3 U PRBC 3/1 - s/p flex sig 3/1 and 3/2 with non bleeding ulcer Lab Results  Component Value Date   HGB 8.5 (L) 10/20/2021   HGB 8.8 (L) 10/30/2021   HGB 9.4 (L) 11/09/2021   No further bleeding noted / hgb ok  - ? When can start Hep back?     Thrombocytopenia Lab Results  Component Value Date   PLT 122 (L) 10/20/2021   PLT 124 (L) 11/02/2021   PLT 125 (L) 10/31/2021  -DDAVP x1 given 3/1 for dysfunctional platelets    Acute metabolic  encephalopathy -Likely multifactorial including sepsis and uremic -recurrent episodes of bradycardia due to coughing and gagging on vent/vagal response -Unequal pupils noted 2/27, head CT negative on admission P: Maintain neuro protective measures Aspirations precautions  PAD protocol -resume fentanyl drip, goal RASS 0 to -1, cannot use Precedex due to bradycardia,  Delirium precautions  Acute renal failure -Creatinine on admission 3.54, creatinine improved to 1.5, then worse -Azotemia could also be related to GI bleed and steroids -Good response to Lasix + albumin -Third spacing -on CRRT since 3/1 P: Follow renal function, urine output and BMET Avoid nephrotoxins Trying to keep -100 -200 cc on CRRT Hypokalemia repleted   Critical illness polyneuromyopathy -Supportive care -Will need aggressive PT at some point  Protein calorie malnutrition,severe  -continue TF per nutrition recs    Multiple  pressure ulcers present PTA including sacral, ischial tuberosity, vertebral column P: wound care consulted, no need for surgical debridement Optimize nutrition   Best Practice (right click and "Reselect all SmartList Selections" daily)   Diet/type: tubefeeds DVT prophylaxis:  pas due to gib GI prophylaxis: PPI Lines: N/A Foley:  N/A Code Status:  full code Last date of multidisciplinary goals of care discussion: 3/1 Domestic partner Caren Griffins and sister at bedside.  DNR has been issued.  CRRT will be used for limited time to enable fluid removal and facilitate extubation.  Okay for pressors if needed.  They would be agreeable to tracheostomy if prognosis was favorable Appreciate palliative care for family support .  Sister defers to [facility] for healthcare decision making  Labs   CBC: Recent Labs  Lab 10/15/21 0615 10/15/21 0900 10/17/21 0333 10/17/21 2325 11/04/2021 0843 10/22/2021 1500 11/11/2021 0500 10/20/21 0421  WBC 13.9*   < > 15.9* 16.2* 20.0*  --  17.1* 16.2*   NEUTROABS 13.1*  --   --  15.5*  --   --   --   --   HGB 8.6*   < > 9.0* 6.3* 9.1* 9.4* 8.8* 8.5*  HCT 25.7*   < > 25.5* 17.7* 26.2* 26.8* 25.0* 25.1*  MCV 88.3   < > 85.3 83.9 84.8  --  84.7 87.2  PLT 174   < > 198 152 125*  --  124* 122*   < > = values in this interval not displayed.    Basic Metabolic Panel: Recent Labs  Lab 10/16/21 0540 10/16/21 2241 10/17/21 0333 11/09/2021 0843  1600 11/02/2021 0500 11/16/2021 1601 10/20/21 0421  NA 139   < > 141 144 146* 144 139 140  K 4.9   < > 4.2 3.4* 3.1* 3.2* 3.2* 3.7  CL 112*   < > 112* 115* 111 110 106 104  CO2 19*   < > 20* $Rem'23 23 25 26 26  'Pcsa$ GLUCOSE 120*   < > 116* 167* 100* 104* 136* 132*  BUN 160*   < > 175* 167* 158* 131* 84* 67*  CREATININE 3.51*   < > 3.33* 3.23* 2.86* 2.22* 1.51* 1.13  CALCIUM 8.4*   < > 8.1* 8.3* 8.2* 8.1* 7.6* 8.0*  MG 2.2  --  1.8 1.8  --  1.9  --  2.4  PHOS 4.5  --  5.1* 5.6* 4.9* 3.2 2.7 2.5   < > = values in this interval not displayed.   GFR: Estimated Creatinine Clearance: 56.7 mL/min (by C-G formula based on SCr of 1.13 mg/dL). Recent Labs  Lab 10/16/21 2243 10/17/21 0333 10/17/21 2325 11/03/2021 0049 11/09/2021 0843 10/20/2021 1126 10/30/2021 0500 10/20/21 0421  PROCALCITON  --  2.49  --   --   --   --   --   --   WBC  --  15.9* 16.2*  --  20.0*  --  17.1* 16.2*  LATICACIDVEN 2.2* 2.6*  --  2.5*  --  2.5*  --   --     Liver Function Tests: Recent Labs  Lab 10/14/21 0338 10/22/2021 1600 11/08/2021 0500 11/02/2021 1601 10/20/21 0421  AST 25  --   --   --   --   ALT 27  --   --   --   --   ALKPHOS 56  --   --   --   --   BILITOT 0.8  --   --   --   --  PROT 4.2*  --   --   --   --   ALBUMIN 1.9* 2.3* 2.1* 2.1* 2.1*   No results for input(s): LIPASE, AMYLASE in the last 168 hours. No results for input(s): AMMONIA in the last 168 hours.  ABG    Component Value Date/Time   PHART 7.34 (L) 10/17/2021 0015   PCO2ART 34 10/17/2021 0015   PO2ART 88 10/17/2021 0015   HCO3 18.5 (L)  10/17/2021 0015   ACIDBASEDEF 6.6 (H) 10/17/2021 0015   O2SAT 99.4 10/17/2021 0015      Coagulation Profile: Recent Labs  Lab 10/15/21 0258  INR 1.8*    Cardiac Enzymes: No results for input(s): CKTOTAL, CKMB, CKMBINDEX, TROPONINI in the last 168 hours.   HbA1C: No results found for: HGBA1C  CBG: Recent Labs  Lab 10/30/2021 1601 11/14/2021 1943 11/13/2021 2318 10/20/21 0314 10/20/21 0731  GLUCAP 100* 114* 122* 136* 112*       The patient is critically ill with multiple organ systems failure and requires high complexity decision making for assessment and support, frequent evaluation and titration of therapies, application of advanced monitoring technologies and extensive interpretation of multiple databases. Critical Care Time devoted to patient care services described in this note is 45  minutes.   Christinia Gully, MD Pulmonary and [place] [phone]   After 7:00 pm call Elink  [phone]

## 2021-10-20 NOTE — Progress Notes (Signed)
[facility] Progress Note ? ?Trung Wenzl 61 y.o. 02/09/1961 ? ?CC: Rectal bleeding ? ? ?Subjective: ?Patient seen and examined at bedside.  Remains intubated.  No further bleeding episodes. ? ?ROS : Unable to obtain ? ? ?Objective: ?Vital signs in last 24 hours: ?Vitals:  ? 10/20/21 1200 10/20/21 1300  ?BP: (!) 94/57 94/68  ?Pulse: 70 82  ?Resp: (!) 21 (!) 21  ?Temp: 99 ?F (37.2 ?C)   ?SpO2: 95% 96%  ? ? ?Physical Exam: ?General -remains intubated.  Opens eye with verbal command. ?Abdomen -soft, nontender, nondistended, bowel sounds present.  No peritoneal sign ?Chest -good air entry bilaterally. ? ? ?Lab Results: ?Recent Labs  ?  10/29/2021 ?0500 11/09/2021 ?1601 10/20/21 ?0421  ?NA 144 139 140  ?K 3.2* 3.2* 3.7  ?CL 110 106 104  ?CO2 25 26 26   ?GLUCOSE 104* 136* 132*  ?BUN 131* 84* 67*  ?CREATININE 2.22* 1.51* 1.13  ?CALCIUM 8.1* 7.6* 8.0*  ?MG 1.9  --  2.4  ?PHOS 3.2 2.7 2.5  ? ?Recent Labs  ?  11/06/2021 ?1601 10/20/21 ?0421  ?ALBUMIN 2.1* 2.1*  ? ?Recent Labs  ?  10/17/21 ?2325 11/06/2021 ?12/18/21 11/11/2021 ?0500 10/20/21 ?0421  ?WBC 16.2*   < > 17.1* 16.2*  ?NEUTROABS 15.5*  --   --   --   ?HGB 6.3*   < > 8.8* 8.5*  ?HCT 17.7*   < > 25.0* 25.1*  ?MCV 83.9   < > 84.7 87.2  ?PLT 152   < > 124* 122*  ? < > = values in this interval not displayed.  ? ?No results for input(s): LABPROT, INR in the last 72 hours. ? ? ? ?Assessment/Plan: ?-Bright blood per rectum.  Flexible sigmoidoscopy showed large nonbleeding rectal ulcer. ?-Acute blood loss anemia. -GI bleeding scan negative x2.  Unable to get CTA because of elevated kidney functions ?-COVID and Pseudomonas pneumonia.  Off isolation for COVID now ?-Septic shock ?-AKI ? ?Recommendations ?------------------------ ?-No further bleeding episodes.  Hemoglobin stable. ?-Continue Cortenema ?-Okay to start heparin on Sunday if no further bleeding in the next 48 hours. ?-GI will sign off.  Call Thursday back if needed ? ? ?[place] MD, FACP ?10/20/2021, 2:01 PM ? ?Contact #   [phone]  ?

## 2021-10-20 NOTE — Progress Notes (Signed)
Palliative: ? ?I met today at [facility] bedside. He is alert and following commands, tracking, nodding head yes/no appropriately. Tolerating CRRT and meeting goals, no further evidence of bleeding, and weaning on vent at this time. No family at bedside. Discussed with RN to share with Caren Griffins when I come that I stopped by and to call me if she has any questions/concerns.  ? ?No charge ? ?Vinie Sill, NP ?Palliative Medicine Team ?Pager [phone] (Please see [email or web address] for schedule) ?Team Phone [phone]  ? ?

## 2021-10-20 NOTE — Progress Notes (Signed)
?[facility] ?Progress Note  ? ? ?Assessment/ Plan:   ? AKI/ azotemia: In the setting of sepsis/ multiple insults.  Making urine.  I suspect hypoperfusion/ ATN.  Likely will end up needing CRRT--> but UOP has picked up in the last few hours after albumin challnge. ?   ?            - azotemia marked- likely GI bleed/ steroids/ catabolic state ?            - started CRRT 3/1 all 4K bath, no heparin (suspect uremic plt dysfunction) ?            - time-limited trial of 48-72 hrs is most appropriate ?            - discussed with partner at bedside 10/17/21, she is in agreement ? - will assess in AM for holding CRRT ?  ?2.  Acute hypoxic and hypercapneic respiratory failure: intubated, failed extubation 2/27.  Per PCCM.  Respiratory culture growing pseudomonas from 2/27 (unchanged), on levofloxacin ?  ?3.  COVID pneumonia: s/p remdesivir, decadron, antibiotics ?  ?4.  Lower GI bleed- Gi evaluation, flex sig 3/1 unrevealing (poor prep), s/p tagged RBC scan 10/22/2021, repeat flex sig showing rectal ulcer, on cortenema ?  ?5.  Dispo: ICU ? ?Subjective:   ? ?Flex sig with rectal ulcer.  Continues on CRRT.  Numbers greatly improved.    ? ?Objective:   ?BP (!) 89/63   Pulse 79   Temp 99 ?F (37.2 ?C) (Bladder)   Resp (!) 21   Ht $R'5\' 11"'kM$  (1.803 m)   Wt 58.4 kg   SpO2 96%   BMI 17.96 kg/m?  ? ?Intake/Output Summary (Last 24 hours) at 10/20/2021 1436 ?Last data filed at 10/20/2021 1400 ?Gross per 24 hour  ?Intake 2266.96 ml  ?Output 4470.5 ml  ?Net -2203.54 ml  ? ?Weight change:  ? ?Physical Exam:   ?GEN ill-appearing, intubated, opens eyes intermittently to voice ?HEENT + temporal wasting ?NECK no JVD ?PULM mech bilaterally ?CV RRR ?ABD soft ?EXT 2+ anasarca ?NEURO intubated, sedated ? ?Imaging: ?NM GI Blood Loss ? ?Result Date: 10/25/2021 ?CLINICAL DATA:  Blood in bowel movements, decreasing hemoglobin EXAM: NUCLEAR MEDICINE GASTROINTESTINAL BLEEDING SCAN TECHNIQUE: Sequential abdominal images were obtained following  intravenous administration of Tc-66m labeled red blood cells. RADIOPHARMACEUTICALS:  22.0 mCi Tc-52m pertechnetate in-vitro labeled red cells. COMPARISON:  10/16/2021, 10/15/2021 FINDINGS: Anterior planar imaging of the abdomen and pelvis was performed for 2 hours after radiotracer administration. Normal physiologic distribution of radiotracer seen within the heart, vascular structures, liver, and spleen. No abnormal radiotracer accumulation to suggest active gastrointestinal bleeding. IMPRESSION: 1. No evidence of active gastrointestinal bleeding. Electronically Signed   By: Randa Ngo M.D.   On: 10/22/2021 18:58  ? ?DG CHEST PORT 1 VIEW ? ?Result Date: 10/29/2021 ?CLINICAL DATA:  Intubated, COVID positive EXAM: PORTABLE CHEST 1 VIEW COMPARISON:  Chest radiograph 1 day prior FINDINGS: The endotracheal tube is in the upper trachea above the level of the clavicular heads approximately 10 cm from the carina. Recommend advancement by approximately 4 cm. The right upper extremity PICC is stable terminating in the mid to lower SVC. The left IJ central venous catheter is stable terminating near the confluence of the innominate veins, unchanged. The enteric catheter tip is off the field of view. The cardiomediastinal silhouette is stable. Opacities in both lower lobes, right worse than left, are again seen, overall slightly improved in the interim. Aeration of the  right upper lobe is also improved. No significant pleural effusion is seen. There is no pneumothorax. The bones are stable. IMPRESSION: 1. Endotracheal tube tip is approximately 10 cm from the carina. Recommend advancement by approximately 4 cm. 2. Overall slightly improved aeration in the lungs. These results will be called to the ordering clinician or representative by the Radiologist Assistant, and communication documented in the PACS or [employer]. Electronically Signed   By: Valetta Mole M.D.   On: 10/20/2021 08:08   ? ?Labs: ?BMET ?Recent Labs  ?Lab  10/16/21 ?2003 10/16/21 ?2241 10/17/21 ?7944 11/05/2021 ?4619 10/30/2021 ?1600 11/09/2021 ?0500 10/21/2021 ?1601 10/20/21 ?0421  ?NA 139 142 141 144 146* 144 139 140  ?K 4.9 4.5 4.2 3.4* 3.1* 3.2* 3.2* 3.7  ?CL 112* 111 112* 115* 111 110 106 104  ?CO2 19* 21* 20* $Remov'23 23 25 26 26  'ItJBJU$ ?GLUCOSE 120* 140* 116* 167* 100* 104* 136* 132*  ?BUN 160* 173* 175* 167* 158* 131* 84* 67*  ?CREATININE 3.51* 3.42* 3.33* 3.23* 2.86* 2.22* 1.51* 1.13  ?CALCIUM 8.4* 8.3* 8.1* 8.3* 8.2* 8.1* 7.6* 8.0*  ?PHOS 4.5  --  5.1* 5.6* 4.9* 3.2 2.7 2.5  ? ?CBC ?Recent Labs  ?Lab 10/15/21 ?0615 10/15/21 ?0900 10/17/21 ?2325 10/22/2021 ?0122 11/07/2021 ?1500 11/04/2021 ?0500 10/20/21 ?0421  ?WBC 13.9*   < > 16.2* 20.0*  --  17.1* 16.2*  ?NEUTROABS 13.1*  --  15.5*  --   --   --   --   ?HGB 8.6*   < > 6.3* 9.1* 9.4* 8.8* 8.5*  ?HCT 25.7*   < > 17.7* 26.2* 26.8* 25.0* 25.1*  ?MCV 88.3   < > 83.9 84.8  --  84.7 87.2  ?PLT 174   < > 152 125*  --  124* 122*  ? < > = values in this interval not displayed.  ? ? ?Medications:   ? ? acetylcysteine  2 mL Nebulization QID  ? albuterol  2.5 mg Nebulization QID  ? chlorhexidine  15 mL Mouth Rinse BID  ? chlorhexidine gluconate (MEDLINE KIT)  15 mL Mouth Rinse BID  ? Chlorhexidine Gluconate Cloth  6 each Topical Q0600  ? collagenase   Topical Daily  ? docusate  100 mg Per Tube BID  ? feeding supplement (PROSource TF)  90 mL Per Tube BID  ? fentaNYL (SUBLIMAZE) injection  50 mcg Intravenous Once  ? hydrocortisone  100 mg Rectal QHS  ? mouth rinse  15 mL Mouth Rinse 10 times per day  ? nutrition supplement (JUVEN)  1 packet Per Tube BID BM  ? pantoprazole (PROTONIX) IV  40 mg Intravenous Q12H  ? permethrin   Topical Once  ? polyethylene glycol  17 g Per Tube BID  ? sodium chloride flush  10-40 mL Intracatheter Q12H  ? ? ? ? ?Madelon Lips, MD ?10/20/2021, 2:36 PM   ?

## 2021-10-21 ENCOUNTER — Encounter (HOSPITAL_COMMUNITY): Payer: Self-pay | Admitting: Internal Medicine

## 2021-10-21 ENCOUNTER — Telehealth: Payer: Self-pay | Admitting: Internal Medicine

## 2021-10-21 ENCOUNTER — Inpatient Hospital Stay (HOSPITAL_COMMUNITY): Payer: 59

## 2021-10-21 DIAGNOSIS — D6959 Other secondary thrombocytopenia: Secondary | ICD-10-CM | POA: Diagnosis present

## 2021-10-21 DIAGNOSIS — G6281 Critical illness polyneuropathy: Secondary | ICD-10-CM | POA: Clinically undetermined

## 2021-10-21 DIAGNOSIS — A419 Sepsis, unspecified organism: Secondary | ICD-10-CM | POA: Diagnosis not present

## 2021-10-21 DIAGNOSIS — K922 Gastrointestinal hemorrhage, unspecified: Secondary | ICD-10-CM | POA: Diagnosis present

## 2021-10-21 DIAGNOSIS — R6520 Severe sepsis without septic shock: Secondary | ICD-10-CM | POA: Diagnosis not present

## 2021-10-21 DIAGNOSIS — Z7189 Other specified counseling: Secondary | ICD-10-CM | POA: Diagnosis not present

## 2021-10-21 DIAGNOSIS — Z515 Encounter for palliative care: Secondary | ICD-10-CM

## 2021-10-21 DIAGNOSIS — U071 Other secondary thrombocytopenia: Secondary | ICD-10-CM | POA: Diagnosis present

## 2021-10-21 DIAGNOSIS — G9341 Metabolic encephalopathy: Secondary | ICD-10-CM | POA: Diagnosis present

## 2021-10-21 DIAGNOSIS — N179 Acute kidney failure, unspecified: Secondary | ICD-10-CM | POA: Diagnosis not present

## 2021-10-21 DIAGNOSIS — J9600 Acute respiratory failure, unspecified whether with hypoxia or hypercapnia: Secondary | ICD-10-CM | POA: Diagnosis not present

## 2021-10-21 HISTORY — DX: Critical illness polyneuropathy: G62.81

## 2021-10-21 LAB — BLOOD GAS, ARTERIAL
Acid-Base Excess: 1.8 mmol/L (ref 0.0–2.0)
Bicarbonate: 25.3 mmol/L (ref 20.0–28.0)
Drawn by: 25788
FIO2: 80 %
O2 Saturation: 97.5 %
Patient temperature: 37.6
pCO2 arterial: 35 mmHg (ref 32–48)
pH, Arterial: 7.47 — ABNORMAL HIGH (ref 7.35–7.45)
pO2, Arterial: 98 mmHg (ref 83–108)

## 2021-10-21 LAB — GLUCOSE, CAPILLARY
Glucose-Capillary: 119 mg/dL — ABNORMAL HIGH (ref 70–99)
Glucose-Capillary: 106 mg/dL — ABNORMAL HIGH (ref 70–99)
Glucose-Capillary: 103 mg/dL — ABNORMAL HIGH (ref 70–99)
Glucose-Capillary: 110 mg/dL — ABNORMAL HIGH (ref 70–99)
Glucose-Capillary: 136 mg/dL — ABNORMAL HIGH (ref 70–99)
Glucose-Capillary: 121 mg/dL — ABNORMAL HIGH (ref 70–99)

## 2021-10-21 LAB — RENAL FUNCTION PANEL
Albumin: 1.8 g/dL — ABNORMAL LOW (ref 3.5–5.0)
Albumin: 1.7 g/dL — ABNORMAL LOW (ref 3.5–5.0)
Anion gap: 6 (ref 5–15)
Anion gap: 5 (ref 5–15)
BUN: 62 mg/dL — ABNORMAL HIGH (ref 8–23)
BUN: 53 mg/dL — ABNORMAL HIGH (ref 8–23)
CO2: 25 mmol/L (ref 22–32)
CO2: 22 mmol/L (ref 22–32)
Calcium: 7.4 mg/dL — ABNORMAL LOW (ref 8.9–10.3)
Calcium: 6.7 mg/dL — ABNORMAL LOW (ref 8.9–10.3)
Chloride: 105 mmol/L (ref 98–111)
Chloride: 104 mmol/L (ref 98–111)
Creatinine, Ser: 1.15 mg/dL (ref 0.61–1.24)
Creatinine, Ser: 0.92 mg/dL (ref 0.61–1.24)
GFR, Estimated: >60 mL/min (ref >60)
GFR, Estimated: >60 mL/min (ref >60)
Glucose, Bld: 117 mg/dL — ABNORMAL HIGH (ref 70–99)
Glucose, Bld: 222 mg/dL — ABNORMAL HIGH (ref 70–99)
Phosphorus: 2 mg/dL — ABNORMAL LOW (ref 2.5–4.6)
Phosphorus: 1.6 mg/dL — ABNORMAL LOW (ref 2.5–4.6)
Potassium: 4.2 mmol/L (ref 3.5–5.1)
Potassium: 3.9 mmol/L (ref 3.5–5.1)
Sodium: 136 mmol/L (ref 135–145)
Sodium: 131 mmol/L — ABNORMAL LOW (ref 135–145)

## 2021-10-21 LAB — CBC
HCT: 23.7 % — ABNORMAL LOW (ref 39.0–52.0)
Hemoglobin: 7.8 g/dL — ABNORMAL LOW (ref 13.0–17.0)
MCH: 29.4 pg (ref 26.0–34.0)
MCHC: 32.9 g/dL (ref 30.0–36.0)
MCV: 89.4 fL (ref 80.0–100.0)
Platelets: 141 10*3/uL — ABNORMAL LOW (ref 150–400)
RBC: 2.65 MIL/uL — ABNORMAL LOW (ref 4.22–5.81)
RDW: 16.1 % — ABNORMAL HIGH (ref 11.5–15.5)
WBC: 15.8 10*3/uL — ABNORMAL HIGH (ref 4.0–10.5)
nRBC: 0 % (ref 0.0–0.2)

## 2021-10-21 LAB — CULTURE, RESPIRATORY W GRAM STAIN

## 2021-10-21 LAB — CORTISOL: Cortisol, Plasma: 24.5 ug/dL

## 2021-10-21 LAB — MAGNESIUM: Magnesium: 2.2 mg/dL (ref 1.7–2.4)

## 2021-10-21 MED ORDER — POLYETHYLENE GLYCOL 3350 17 G PO PACK
17.0000 g | PACK | Freq: Every day | ORAL | Status: DC
  Filled 2021-10-21: qty 1

## 2021-10-21 MED ORDER — NOREPINEPHRINE 4 MG/250ML-% IV SOLN
INTRAVENOUS | Status: AC
  Filled 2021-10-21: qty 250

## 2021-10-21 MED ORDER — NOREPINEPHRINE 4 MG/250ML-% IV SOLN
0.0000 ug/min | INTRAVENOUS | Status: DC
  Administered 2021-10-21: 5 ug/min via INTRAVENOUS
  Administered 2021-10-21: 2 ug/min via INTRAVENOUS
  Administered 2021-10-22: 3 ug/min via INTRAVENOUS
  Administered 2021-10-23: 8 ug/min via INTRAVENOUS
  Administered 2021-10-23: 10 ug/min via INTRAVENOUS
  Filled 2021-10-21 (×4): qty 250

## 2021-10-21 MED ORDER — PROPOFOL 1000 MG/100ML IV EMUL
0.0000 ug/kg/min | INTRAVENOUS | Status: DC
  Administered 2021-10-21: 5 ug/kg/min via INTRAVENOUS
  Administered 2021-10-22 (×2): 15 ug/kg/min via INTRAVENOUS
  Administered 2021-10-23 (×2): 25 ug/kg/min via INTRAVENOUS
  Filled 2021-10-21 (×5): qty 100

## 2021-10-21 MED ORDER — DOCUSATE SODIUM 50 MG/5ML PO LIQD
100.0000 mg | Freq: Two times a day (BID) | ORAL | Status: DC
  Filled 2021-10-21 (×2): qty 10

## 2021-10-21 MED ORDER — SODIUM CHLORIDE 0.9 % IV BOLUS
500.0000 mL | Freq: Once | INTRAVENOUS | Status: AC
  Administered 2021-10-21: 500 mL via INTRAVENOUS

## 2021-10-21 MED ORDER — POLYETHYLENE GLYCOL 3350 17 G PO PACK: 17.0000 g | PACK | Freq: Every day | ORAL | Status: DC

## 2021-10-21 NOTE — Progress Notes (Addendum)
Palliative: ? ?HPI: 61 y.o. male  with past medical history of depression, anxiety, ankle injury admitted on 09/23/2021 with 3 days of altered mental status/weakness and found by EMS to be soiled in stool/urine in recliner. Found to have sepsis, COVID, pseudomonas pneumonia with ARDS and likely underlying COPD. Hospitalization complicated further by GIB (plans for EGD), acute renal failure, failed extubation. Frail with multiple pressure ulcers present at time of admission. DNR decided 11/05/2021 (family defer decisions for his significant other). Plans for CRRT time trial of 3-4 days. Considering tracheostomy if this is thought to lead to favorable outcome. Overall failure to thrive prior to admission and acute illness.  ? ?I met today at [facility] bedside. RN reports recent changes with desaturation and FiO2 increased to 80% and hypotension requiring vasopressor support. Significant other, Caren Griffins, at bedside. We discussed his changes and decline. Caren Griffins is appropriately tearful - I acknowledge the roller coaster this has been. She was called to bedside on Wed and thought that he would not live through the day but then he had some progress over the past couple days and not acute decline again. We reviewed the fragility of his underlying health prior to this acute illness and that his body may not have the reserve and resources to overcome this illness. She understands his poor prognosis and our medical limitations if he continues to decline. No plans for comfort care at this time. We did review that with his decline I am less inclined to believe that he would do well with tracheostomy. I explained that even if he was doing as well as he was yesterday that a tracheostomy would be a very long journey with no guarantees of improvement and concerns for where we would end up with his overall quality of life. Caren Griffins wishes to continue with current care at this time as well as ensuring that Frederico Hamman is not suffering and able  to rest more comfortably on vent.  ? ?All questions/concerns addressed. Emotional support provided. Discussed with Roselyn Reef, RN. Updated Dr. Vaughan Browner to my conversation.  ? ?Exam: Sedated on vent. Labored breathing, 80% FiO2. Abd soft. CRRT going - plans to stop this afternoon. BP low - vasopressors initiated. Warm to touch. Still edematous in extremities although improved from previous days.  ? ?Plan: ?- DNR already established.  ?- Continue current care. Caren Griffins knows that he may be approaching end of life. Discussed poor prognosis.  ?- I have requested that my colleague Dr. Loistine Chance follow up tomorrow.  ? ?50 min ? ?Vinie Sill, NP ?Palliative Medicine Team ?Pager [phone] (Please see [email or web address] for schedule) ?Team Phone [phone]  ? ? ?Greater than 50%  of this time was spent counseling and coordinating care related to the above assessment and plan   ?

## 2021-10-21 NOTE — Progress Notes (Signed)
?[facility] ?Progress Note  ? ? ?Assessment/ Plan:   ? AKI/ azotemia: In the setting of sepsis/ multiple insults.  Making urine.  I suspect hypoperfusion/ ATN.  Likely will end up needing CRRT--> but UOP has picked up in the last few hours after albumin challnge. ?   ?            - azotemia marked- likely GI bleed/ steroids/ catabolic state ?            - started CRRT 3/1 all 4K bath, no heparin (suspect uremic plt dysfunction) ?            - time-limited trial of 48-72 hrs is most appropriate ?            - discussed with partner at bedside 10/17/21, she is in agreement ? - pause CRRT 3/4 pm and follow UOP and labs ?  ?2.  Acute hypoxic and hypercapneic respiratory failure: intubated, failed extubation 2/27.  Per PCCM.  Respiratory culture growing pseudomonas from 2/27 (unchanged), on levofloxacin ?  ?3.  COVID pneumonia: s/p remdesivir, decadron, antibiotics ?  ?4.  Lower GI bleed- Gi evaluation, flex sig 3/1 unrevealing (poor prep), s/p tagged RBC scan 11/02/2021, repeat flex sig showing rectal ulcer, on cortenema ?  ?5.  Dispo: ICU ? ?Subjective:   ? ?760 in bladder scan this AM.  Remains on CRRT, kept even overnight.  Plan to take down CRRT this PM and follow numbers.    ? ?Objective:   ?BP (!) 87/47   Pulse 88   Temp 98.4 ?F (36.9 ?C) (Axillary)   Resp (!) 25   Ht _0  (1.803 m)   Wt 56.9 kg   SpO2 95%   BMI 17.50 kg/m?  ? ?Intake/Output Summary (Last 24 hours) at 10/21/2021 1143 ?Last data filed at 10/21/2021 1100 ?Gross per 24 hour  ?Intake 2791.02 ml  ?Output 1742.2 ml  ?Net 1048.82 ml  ? ?Weight change: -1.5 kg ? ?Physical Exam:   ?GEN ill-appearing, intubated, increased responsiveness ?HEENT + temporal wasting ?NECK no JVD ?PULM mech bilaterally ?CV RRR ?ABD soft ?EXT 2+ anasarca is greatly improved ?NEURO intubated, sedated ? ?Imaging: ?No results found. ? ?Labs: ?BMET ?Recent Labs  ?Lab 10/22/2021 ?0102 10/27/2021 ?1600 10/26/2021 ?0500 11/08/2021 ?1601 10/20/21 ?0421 10/20/21 ?1733  10/21/21 ?0436  ?NA 144 146* 144 139 140 137 136  ?K 3.4* 3.1* 3.2* 3.2* 3.7 3.8 4.2  ?CL 115* 111 110 106 104 104 105  ?CO2 _1 ?GLUCOSE 167* 100* 104* 136* 132* 118* 117*  ?BUN 167* 158* 131* 84* 67* 64* 62*  ?CREATININE 3.23* 2.86* 2.22* 1.51* 1.13 1.19 1.15  ?CALCIUM 8.3* 8.2* 8.1* 7.6* 8.0* 8.0* 7.4*  ?PHOS 5.6* 4.9* 3.2 2.7 2.5 2.1* 2.0*  ? ?CBC ?Recent Labs  ?Lab 10/15/21 ?0615 10/15/21 ?0900 10/17/21 ?2325 10/25/2021 ?7253 11/13/2021 ?1500 11/15/2021 ?0500 10/20/21 ?0421 10/20/21 ?1955  ?WBC 13.9*   < > 16.2* 20.0*  --  17.1* 16.2* 18.4*  ?NEUTROABS 13.1*  --  15.5*  --   --   --   --   --   ?HGB 8.6*   < > 6.3* 9.1* 9.4* 8.8* 8.5* 8.3*  ?HCT 25.7*   < > 17.7* 26.2* 26.8* 25.0* 25.1* 24.4*  ?MCV 88.3   < > 83.9 84.8  --  84.7 87.2 87.5  ?PLT 174   < > 152 125*  --  124* 122* 127*  ? < > =  values in this interval not displayed.  ? ? ?Medications:   ? ? acetylcysteine  2 mL Nebulization QID  ? albuterol  2.5 mg Nebulization QID  ? chlorhexidine gluconate (MEDLINE KIT)  15 mL Mouth Rinse BID  ? Chlorhexidine Gluconate Cloth  6 each Topical Q0600  ? collagenase   Topical Daily  ? docusate  100 mg Per Tube BID  ? feeding supplement (PROSource TF)  90 mL Per Tube BID  ? fentaNYL (SUBLIMAZE) injection  50 mcg Intravenous Once  ? gabapentin  400 mg Per Tube Q8H  ? hydrocortisone  100 mg Rectal QHS  ? mouth rinse  15 mL Mouth Rinse 10 times per day  ? nutrition supplement (JUVEN)  1 packet Per Tube BID BM  ? pantoprazole (PROTONIX) IV  40 mg Intravenous Q12H  ? permethrin   Topical Once  ? polyethylene glycol  17 g Per Tube BID  ? sodium chloride flush  10-40 mL Intracatheter Q12H  ? ? ? ? ?Madelon Lips, MD ?10/21/2021, 11:43 AM   ?

## 2021-10-21 NOTE — Telephone Encounter (Signed)
Please ignore. I was testing telephone call protocol.  ? ?

## 2021-10-21 NOTE — Progress Notes (Signed)
RN noted pt HR 130's, O2 saturation 85%, RR 30.  Pt also non-compliant with ventilator.  PRN medications given per MAR.  RT called, MD paged.  New orders placed by MD.  RT adjusted vent settings, pt O2 sat >92%, HR <100, RR 22.  Pt resting comfortably with CPOT of 0.  Significant other at bedside.  RN will continue to carefully monitor for changes. ? ?Ulis Rias, RN ?10/21/2021 ?5:47 PM ? ?

## 2021-10-21 NOTE — Assessment & Plan Note (Deleted)
Completed remdesivir ?Stopped dexamethasone (pD8)  ?Not given baricitinib due to superimposed pneumonia ?Off isolation precautions on day 11 of hospitalization, ?

## 2021-10-21 NOTE — Assessment & Plan Note (Addendum)
Continue TF per nutrition recs. ?

## 2021-10-21 NOTE — Assessment & Plan Note (Addendum)
Wean as tolerated, intermittent chest x-ray ?He is not a good candidate for tracheostomy ?

## 2021-10-21 NOTE — Assessment & Plan Note (Addendum)
Monitor CBC 

## 2021-10-21 NOTE — Assessment & Plan Note (Addendum)
On Levophed ?Wean pressors as tolerated ?On levofloxacin for Pseudomonas in sputum but is now spiking fevers ?Recheck tracheal aspirate and blood culture ?Broaden antibiotics to Vanco, Zosyn ? ?

## 2021-10-21 NOTE — Progress Notes (Addendum)
eLink Physician-Brief Progress Note ?Patient Name: Brandon Bond ?DOB: 11/06/1960 ?MRN: [id number] ? ? ?Date of Service ? 10/21/2021  ?HPI/Events of Note ? RN reports Pt had a medium BRB stool, GI already on board. Pt had a (-) blood scan that showed n active GI bleeding. I ordered a CBC to check H/H-  Was 8.3/24.4  10/20/21 from 19:55  ?eICU Interventions ? Follow Hg/hctno heparin gtt yet. Continue to hold.    ? ? ? ?Intervention Category ?Intermediate Interventions: Other: (GIB) ? ?Ranee Gosselin ?10/21/2021, 10:58 PM ? ?23:46 ?Hg down to 7.8. ? ?Follow AM labs. Transfuse if under 7.5 and if re melena or hypotension and tachycardic. ?Type and screen ordered once.  ? ?01:54 ?Had another large bright blood with stable MAP 88, HR 57. ?Ordered CBC stat, transfuse if dropping Hg ? ?GI note: ?Flexible sigmoidoscopy showed large nonbleeding rectal ulcer. ?-Acute blood loss anemia. -GI bleeding scan negative x2.  Unable to get CTA because of elevated kidney functions ?S/p CRRT for last 4 days. Off of CRRT yesterday 5 pm. ? ?Discussed with bed side RN.  ?- stat 1 PRBC ordered ?- follow CBC/INR ?- called Dr Rickard Patience call GI answering service-Kathy. She is going to connect soon.  ? ?2:25 ?Hg at 7.5, Levophed gtt at 4 ?- PRBC now. Did not receive any call back from GI yet ? ?6;48 ?Secure chat to Dr Chip Boer done about this patient, to evaluate.  ?

## 2021-10-21 NOTE — Assessment & Plan Note (Addendum)
Wound care consulted, no need for surgical debridement ?Optimize nutrition. ?

## 2021-10-21 NOTE — Assessment & Plan Note (Addendum)
Supportive care. ?Will need aggressive PT at some point if he survives this illness. ?

## 2021-10-21 NOTE — Assessment & Plan Note (Addendum)
Off CRRT and is making some urine ?Monitor labs ?

## 2021-10-21 NOTE — Assessment & Plan Note (Addendum)
Continues to have intermittent blood per rectum. ?GI has been reconsulted.  Holding heparin. ? ?

## 2021-10-21 NOTE — Progress Notes (Signed)
? ?NAME:  Brandon Bond, MRN:  [id number], DOB:  1960-10-13, LOS: 73 ?ADMISSION DATE:  10/17/2021, CONSULTATION DATE:  10/12/2021 ?REFERRING MD:  Dr. Aileen Fass, CHIEF COMPLAINT:  Acute respiratory distress   ? ?History of Present Illness:  ?Brandon Bond is a [age] yow[age] never vax for covid with no reported past medical history who presented to the emergency department due to altered mental status that began 3 days prior to admission.  Per report EMS found patient soiled in stool and urine in her recliner.  He was seen tachycardic and hypoxic with saturation 70% on room air. ? ?On ED arrival patient met sepsis criteria with acute organ dysfunction.  He was also found to be COVID-positive.  Head CT negative.  CT abdomen pelvis revealed bilateral lower lobe pneumonia.  By morning of 2/23 patient was seen with progressive hypoxia and acidosis despite max BiPAP settings resulting in PCCM consult ? ?Pertinent  Medical History  ?None ? ?Significant Hospital Events: ?Including procedures, antibiotic start and stop dates in addition to other pertinent events   ?2/20 admitted with altered mental status found to be COVID-positive placed on BiPAP ?2/23 failed BiPAP resulting in PCCM consult and intubation ?2/24 PEEP dropped to 5 ?2/25 Weaned on pressure support all day, but not extubated due to poor mental status  ?2/26 Bright red blood rectum noted with drop in hemoglobin, bleeding scan negative ?2/27 extubated -reintubated after 8h , unable to clear secretions, hypercarbic ?2/28 Bloody bowel movement and hemoglobin dropped to 6.3 ,Developed bradycardia to 40s , given epinephrine, DNR issued  ?2/28 started CRRT, flex sig -non diagnostic, bleeding scan neg ?3/1 and 3/2 flex sig > rectal ulcer, not bleeding  ? ? ?Interim History / Subjective:  ? ?Continues on CRRT.  Stop pulling fluid due to low blood pressure with improvement in hemodynamics ?Remains on the ventilator with PSV trials ? ?Objective   ?Blood pressure (!) 101/58, pulse  96, temperature (!) 97 ?F (36.1 ?C), temperature source Axillary, resp. rate (!) 29, height $RemoveBe'5\' 11"'BoocGsmMm$  (1.803 m), weight 56.9 kg, SpO2 96 %. ?   ?Vent Mode: CPAP;PSV ?FiO2 (%):  [40 %] 40 % ?Set Rate:  [18 bmp] 18 bmp ?Vt Set:  [600 mL] 600 mL ?PEEP:  [5 cmH20] 5 cmH20 ?Pressure Support:  [12 cmH20] 12 cmH20 ?Plateau Pressure:  [16 cmH20-23 cmH20] 20 cmH20  ? ?Intake/Output Summary (Last 24 hours) at 10/21/2021 0835 ?Last data filed at 10/21/2021 0800 ?Gross per 24 hour  ?Intake 2787.62 ml  ?Output 2047.3 ml  ?Net 740.32 ml  ? ?Filed Weights  ? 10/22/2021 0500 10/20/21 0435 10/21/21 0424  ?Weight: 63.5 kg 58.4 kg 56.9 kg  ? ? ?Examination: ?Gen:      No acute distress, frail, cachectic ?HEENT:  EOMI, sclera anicteric ?Neck:     No masses; no thyromegaly, ET tube ?Lungs:    Clear to auscultation bilaterally; normal respiratory effort ?CV:         Regular rate and rhythm; no murmurs ?Abd:      + bowel sounds; soft, non-tender; no palpable masses, no distension ?Ext:    No edema; adequate peripheral perfusion ?Skin:      Warm and dry; no rash ?Neuro: Sedated, arousable ?   ? ? ?Resolved Hospital Problem list   ? ?Elevated LFTs  ? ?Assessment & Plan:  ?Principal Problem: ?  Sepsis with acute organ dysfunction without septic shock ([place]) ?Active Problems: ?  Lower GI bleed ?  Acute renal failure (ARF) (H[facility]) ?  Acute respiratory failure with hypoxia ([place]) ?  COVID-19 virus infection ?  Pressure injury of skin ?  Protein-calorie malnutrition, severe ?  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  Acute metabolic encephalopathy ?  Thrombocytopenia due to COVID-19 virus ?  Palliative care status ?  ?Septic shock secondary to pneumonia, resolved ?-Respiratory culture showed Pseudomonas, pansensitive , completed course of cefepime x7ds , but persistent on repeat culture 2/27 ?-urine strep antigen negative Legionella negative ?Echo showed normal LV function, RVSP 54 ?P: ?Await sensitivities, will treat with Levaquin @  3/1 >>> 7 days planned   ? 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 ? VAP bundle in place  ?DuoNebs ?Wean sedation and continue PSV trials as tol  ?Covid pneumonia ?-Completed remdesivir ?-stopped dexamethasone (pD8)  ?-Not given baricitinib due to superimposed pneumonia ?- DC' d  isolation precautions on day 11 of hospitalization, ? ?Lower GI bleed, bleeding scan negative x2 ?Hemoglobin has drifted down from 15 on admit to 6.3 at lowest ?S/p 3 U PRBC 3/1 ?- s/p flex sig 3/1 and 3/2 with non bleeding ulcer ? No further bleeding noted / hgb ok  ?- ? When can start Hep back?  ? ?Thrombocytopenia ?[place] CBC ?  ?Acute metabolic encephalopathy ?-Likely multifactorial including sepsis and uremic ?-recurrent episodes of bradycardia due to coughing and gagging on vent/vagal response ?-Unequal pupils noted 2/27, head CT negative on admission ?P: ?Maintain neuro protective measures ?Aspirations precautions  ?PAD protocol -resume fentanyl drip, goal RASS 0 to -1, cannot use Precedex due to bradycardia,  ?Delirium precautions ? ?Acute renal failure ?-Creatinine on admission 3.54, creatinine improved to 1.5, then worse ?-Azotemia could also be related to GI bleed and steroids ?-Good response to Lasix + albumin ?-Third spacing ?-on CRRT since 3/1 ?P: ?Follow renal function, urine output and BMET ?Avoid nephrotoxins ?Keep even on CRRT ? ?Critical illness polyneuromyopathy ?-Supportive care ?-Will need aggressive PT at some point ? ?Protein calorie malnutrition,severe  ?-continue TF per nutrition recs ?  ?Multiple pressure ulcers present PTA including sacral, ischial tuberosity, vertebral column ?P: ?wound care consulted, no need for surgical debridement ?Optimize  nutrition ? ?Best Practice (right click and "Reselect all SmartList Selections" daily)  ? ?Diet/type: tubefeeds ?DVT prophylaxis:  pas due to gib ?GI prophylaxis: PPI ?Lines: N/A ?Foley:  N/A ?Code Status:  full code ?Last date of multidisciplinary goals of care discussion: 3/1 Domestic partner Caren Griffins and sister at bedside.  DNR has been issued.  CRRT will be used for limited time to enable fluid removal and facilitate extubation.  Okay for pressors if needed.  They would be agreeable to tracheostomy if prognosis was favorable ?Appreciate palliative care for family support .  Sister defers to [place] for healthcare decision making ? ?Critical care time:  ? ?The patient is critically ill with multiple organ system failure and requires high complexity decision making for assessment and support, frequent evaluation and titration of therapies, advanced monitoring, review of radiographic studies and interpretation of complex data.  ? ?Critical Care Time devoted to patient care services, exclusive of separately billable procedures, described in this note is 35 minutes.  ? ?Marshell Garfinkel MD ?[facility] ?See Amion for pager ? ?If no response to pager , please call [phone] until  7pm ?After 7:00 pm call Elink  [phone] ?10/21/2021, 9:04 AM ? ? ?

## 2021-10-21 NOTE — Assessment & Plan Note (Addendum)
Maintain neuro protective measures ?Aspirations precautions  ?Continue fentanyl.  Propofol started yesterday for agitation. ?Cannot use Precedex due to bradycardia,  ?Delirium precautions. ?

## 2021-10-21 NOTE — Progress Notes (Signed)
Patients BP has consistently been low (80/50's) when trying to pull fluid off via crrt. Patients edema has mostly subsided.  ? ?MD Signe Colt paged,  ? ?Orders received to stop pulling fluid and administer Nacl bolus.  ? ?Will continue to monitor.  ?

## 2021-10-21 NOTE — Assessment & Plan Note (Addendum)
Ongoing discussions with significant other who is the decision maker.  I have updated her about his deterioration and poor prognosis for recovery.  Palliative care is on board and appreciate their support ?He is currently DNR. ? ?

## 2021-10-22 ENCOUNTER — Inpatient Hospital Stay (HOSPITAL_COMMUNITY): Payer: 59

## 2021-10-22 ENCOUNTER — Encounter (HOSPITAL_COMMUNITY): Payer: Self-pay | Admitting: Gastroenterology

## 2021-10-22 DIAGNOSIS — G6281 Critical illness polyneuropathy: Secondary | ICD-10-CM | POA: Diagnosis not present

## 2021-10-22 DIAGNOSIS — Z515 Encounter for palliative care: Secondary | ICD-10-CM | POA: Diagnosis not present

## 2021-10-22 DIAGNOSIS — G9341 Metabolic encephalopathy: Secondary | ICD-10-CM | POA: Diagnosis not present

## 2021-10-22 DIAGNOSIS — A419 Sepsis, unspecified organism: Secondary | ICD-10-CM | POA: Diagnosis not present

## 2021-10-22 DIAGNOSIS — R6520 Severe sepsis without septic shock: Secondary | ICD-10-CM | POA: Diagnosis not present

## 2021-10-22 DIAGNOSIS — N179 Acute kidney failure, unspecified: Secondary | ICD-10-CM | POA: Diagnosis not present

## 2021-10-22 LAB — CBC
HCT: 23 % — ABNORMAL LOW (ref 39.0–52.0)
Hemoglobin: 7.5 g/dL — ABNORMAL LOW (ref 13.0–17.0)
MCH: 29.5 pg (ref 26.0–34.0)
MCHC: 32.6 g/dL (ref 30.0–36.0)
MCV: 90.6 fL (ref 80.0–100.0)
Platelets: 158 10*3/uL (ref 150–400)
RBC: 2.54 MIL/uL — ABNORMAL LOW (ref 4.22–5.81)
RDW: 16.2 % — ABNORMAL HIGH (ref 11.5–15.5)
WBC: 13.9 10*3/uL — ABNORMAL HIGH (ref 4.0–10.5)
nRBC: 0 % (ref 0.0–0.2)

## 2021-10-22 LAB — GLUCOSE, CAPILLARY
Glucose-Capillary: 105 mg/dL — ABNORMAL HIGH (ref 70–99)
Glucose-Capillary: 79 mg/dL (ref 70–99)
Glucose-Capillary: 90 mg/dL (ref 70–99)
Glucose-Capillary: 115 mg/dL — ABNORMAL HIGH (ref 70–99)
Glucose-Capillary: 120 mg/dL — ABNORMAL HIGH (ref 70–99)
Glucose-Capillary: 126 mg/dL — ABNORMAL HIGH (ref 70–99)

## 2021-10-22 LAB — RENAL FUNCTION PANEL
Albumin: 1.7 g/dL — ABNORMAL LOW (ref 3.5–5.0)
Albumin: 1.9 g/dL — ABNORMAL LOW (ref 3.5–5.0)
Anion gap: 6 (ref 5–15)
Anion gap: 8 (ref 5–15)
BUN: 82 mg/dL — ABNORMAL HIGH (ref 8–23)
BUN: 95 mg/dL — ABNORMAL HIGH (ref 8–23)
CO2: 25 mmol/L (ref 22–32)
CO2: 24 mmol/L (ref 22–32)
Calcium: 7.4 mg/dL — ABNORMAL LOW (ref 8.9–10.3)
Calcium: 7.9 mg/dL — ABNORMAL LOW (ref 8.9–10.3)
Chloride: 105 mmol/L (ref 98–111)
Chloride: 107 mmol/L (ref 98–111)
Creatinine, Ser: 1.5 mg/dL — ABNORMAL HIGH (ref 0.61–1.24)
Creatinine, Ser: 1.64 mg/dL — ABNORMAL HIGH (ref 0.61–1.24)
GFR, Estimated: 53 mL/min — ABNORMAL LOW (ref >60)
GFR, Estimated: 47 mL/min — ABNORMAL LOW (ref >60)
Glucose, Bld: 89 mg/dL (ref 70–99)
Glucose, Bld: 155 mg/dL — ABNORMAL HIGH (ref 70–99)
Phosphorus: 3.2 mg/dL (ref 2.5–4.6)
Phosphorus: 4.1 mg/dL (ref 2.5–4.6)
Potassium: 4.6 mmol/L (ref 3.5–5.1)
Potassium: 4.5 mmol/L (ref 3.5–5.1)
Sodium: 136 mmol/L (ref 135–145)
Sodium: 139 mmol/L (ref 135–145)

## 2021-10-22 LAB — BASIC METABOLIC PANEL
Anion gap: 5 (ref 5–15)
BUN: 81 mg/dL — ABNORMAL HIGH (ref 8–23)
CO2: 26 mmol/L (ref 22–32)
Calcium: 7.4 mg/dL — ABNORMAL LOW (ref 8.9–10.3)
Chloride: 105 mmol/L (ref 98–111)
Creatinine, Ser: 1.41 mg/dL — ABNORMAL HIGH (ref 0.61–1.24)
GFR, Estimated: 57 mL/min — ABNORMAL LOW (ref >60)
Glucose, Bld: 88 mg/dL (ref 70–99)
Potassium: 4.6 mmol/L (ref 3.5–5.1)
Sodium: 136 mmol/L (ref 135–145)

## 2021-10-22 LAB — PROTIME-INR
INR: 1.3 — ABNORMAL HIGH (ref 0.8–1.2)
Prothrombin Time: 16 seconds — ABNORMAL HIGH (ref 11.4–15.2)

## 2021-10-22 LAB — HEMOGLOBIN AND HEMATOCRIT, BLOOD
HCT: 27 % — ABNORMAL LOW (ref 39.0–52.0)
Hemoglobin: 9.1 g/dL — ABNORMAL LOW (ref 13.0–17.0)

## 2021-10-22 LAB — PREPARE RBC (CROSSMATCH)

## 2021-10-22 LAB — TRIGLYCERIDES: Triglycerides: 110 mg/dL (ref <150)

## 2021-10-22 LAB — MAGNESIUM: Magnesium: 2 mg/dL (ref 1.7–2.4)

## 2021-10-22 LAB — PHOSPHORUS: Phosphorus: 3.2 mg/dL (ref 2.5–4.6)

## 2021-10-22 MED ORDER — SODIUM CHLORIDE 0.9% IV SOLUTION: Freq: Once | INTRAVENOUS | Status: AC

## 2021-10-22 MED ORDER — LEVOFLOXACIN IN D5W 250 MG/50ML IV SOLN
250.0000 mg | INTRAVENOUS | Status: DC
  Administered 2021-10-22: 250 mg via INTRAVENOUS
  Filled 2021-10-22: qty 50

## 2021-10-22 NOTE — Progress Notes (Signed)
NAME:  Brandon Bond, MRN:  [id number], DOB:  10/23/1960, LOS: 12 ADMISSION DATE:  09/21/2021, CONSULTATION DATE:  10/12/2021 REFERRING MD:  Dr. Aileen Fass, CHIEF COMPLAINT:  Acute respiratory distress    History of Present Illness:  Brandon Bond is a [age] yow[age] never vax for covid with no reported past medical history who presented to the emergency department due to altered mental status that began 3 days prior to admission.  Per report EMS found patient soiled in stool and urine in her recliner.  He was seen tachycardic and hypoxic with saturation 70% on room air.  On ED arrival patient met sepsis criteria with acute organ dysfunction.  He was also found to be COVID-positive.  Head CT negative.  CT abdomen pelvis revealed bilateral lower lobe pneumonia.  By morning of 2/23 patient was seen with progressive hypoxia and acidosis despite max BiPAP settings resulting in PCCM consult  Pertinent  Medical History  None  Significant Hospital Events: Including procedures, antibiotic start and stop dates in addition to other pertinent events   2/20 admitted with altered mental status found to be COVID-positive placed on BiPAP 2/23 failed BiPAP resulting in PCCM consult and intubation 2/24 PEEP dropped to 5 2/25 Weaned on pressure support all day, but not extubated due to poor mental status  2/26 Bright red blood rectum noted with drop in hemoglobin, bleeding scan negative 2/27 extubated -reintubated after 8h , unable to clear secretions, hypercarbic 2/28 Bloody bowel movement and hemoglobin dropped to 6.3 ,Developed bradycardia to 40s , given epinephrine, DNR issued  2/28 started CRRT, flex sig -non diagnostic, bleeding scan neg 3/1 and 3/2 flex sig > rectal ulcer, not bleeding  3/5 started Levophed.  CRRT stopped, had episodes of blood per rectum.  Transfuse 1 unit PRBC  Interim History / Subjective:   CRRT stopped.  He is making some urine now Had episodes of blood per rectum.  Transfused 1 unit  PRBC  Objective   Blood pressure 98/62, pulse 79, temperature 98.9 F (37.2 C), temperature source Axillary, resp. rate (!) 22, height $RemoveBe'5\' 11"'xAaEuJZpk$  (1.803 m), weight 56.9 kg, SpO2 97 %.    Vent Mode: PRVC FiO2 (%):  [40 %-80 %] 70 % Set Rate:  [18 bmp] 18 bmp Vt Set:  [600 mL] 600 mL PEEP:  [5 cmH20] 5 cmH20 Plateau Pressure:  [21 cmH20-32 cmH20] 23 cmH20   Intake/Output Summary (Last 24 hours) at 10/22/2021 0750 Last data filed at 10/22/2021 0600 Gross per 24 hour  Intake 2492.38 ml  Output 1445.2 ml  Net 1047.18 ml   Filed Weights   10/20/21 0435 10/21/21 0424 10/22/21 0500  Weight: 58.4 kg 56.9 kg 56.9 kg    Examination: Blood pressure 98/62, pulse 79, temperature 98.9 F (37.2 C), temperature source Axillary, resp. rate (!) 22, height $RemoveBe'5\' 11"'ghCXDoCtH$  (1.803 m), weight 56.9 kg, SpO2 97 %. Gen:      No acute distress, cachectic, critically ill-appearing HEENT:  EOMI, sclera anicteric Neck:     No masses; no thyromegaly, ET tube Lungs:    Clear to auscultation bilaterally; normal respiratory effort CV:         Regular rate and rhythm; no murmurs Abd:      + bowel sounds; soft, non-tender; no palpable masses, no distension Ext:    No edema; adequate peripheral perfusion Skin:      Warm and dry; no rash Neuro: Unresponsive  Labs/imaging reviewed Significant for creatinine 1.5. WBC 13.9, Hb 7.5 X-ray reviewed with improved  infiltrates  Resolved Hospital Problem list    Elevated LFTs   Assessment & Plan:   Hospital Problem List as of 10/22/2021          Priority Resolved POA     High     * (Principal) Sepsis with acute organ dysfunction without septic shock (HCC) High  Yes    Overview Signed 10/22/2021  7:59 AM by Marshell Garfinkel, MD     Respiratory culture showed Pseudomonas, pansensitive , completed course of Cefepime x7ds , but persistent on repeat culture 2/27 rine strep antigen negative Legionella negative Echo showed normal LV function, RVSP 54       Current Assessment & Plan  09/26/2021 Hospital Encounter Edited 10/22/2021  8:00 AM by Marshell Garfinkel, MD     Started on pressors due to persistent hypotension Wean pressors as tolerated Continue levofloxacin for 7 days based on sensitivities of Pseudomonas         Acute renal failure (ARF) ([place]) High  Yes    Current Assessment & Plan 10/14/2021 Hospital Encounter Edited 10/22/2021  7:53 AM by Marshell Garfinkel, MD     Off CRRT.  Monitor urine output and creatinine.        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  Failed extubation 2/27 due to secretions and overall weakness       Current Assessment & Plan 09/21/2021 Hospital Encounter Edited 10/22/2021  7:53 AM by Marshell Garfinkel, MD     Wean as tolerated, intermittent chest x-ray He is not a good candidate for tracheostomy        COVID-19 virus infection High  Yes    Current Assessment & Plan 10/15/2021 Hospital Encounter Edited 10/22/2021  7:54 AM by Marshell Garfinkel, MD     Completed remdesivir Stopped dexamethasone (pD8)  Not given baricitinib due to superimposed pneumonia Off isolation precautions on day 11 of hospitalization,        Pressure injury of skin High  Yes    Overview Signed 10/21/2021  8:43 AM by Marshell Garfinkel, MD     Multiple pressure ulcers present PTA including sacral, ischial tuberosity, vertebral column       Current Assessment & Plan 10/16/2021 Hospital Encounter Edited 10/22/2021  7:54 AM by Marshell Garfinkel, MD     Wound care consulted, no need for surgical debridement Optimize nutrition.        Protein-calorie malnutrition, severe High  Yes    Current Assessment & Plan 10/16/2021 Hospital Encounter Edited 10/22/2021  7:54 AM by Marshell Garfinkel, MD     Continue TF per nutrition recs.        Lower GI bleed High  Yes     Overview Signed 10/21/2021  8:45 AM by Marshell Garfinkel, MD     S/p 3 U PRBC 3/1 s/p flex sig 3/1 and 3/2 with non bleeding ulcer       Current Assessment & Plan 10/16/2021 Hospital Encounter Edited 10/22/2021  7:53 AM by Marshell Garfinkel, MD     Continues to have intermittent blood per rectum. GI has been reconsulted.  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) High  Clinically Undetermined    Current Assessment & Plan 09/21/2021 Hospital Encounter Edited 10/22/2021  7:54 AM by Marshell Garfinkel,  MD     Supportive care Will need aggressive PT at some point if he survives this illness.        Acute metabolic encephalopathy High  Yes    Overview Signed 10/21/2021  8:47 AM by Marshell Garfinkel, MD     Likely multifactorial including sepsis and uremic recurrent episodes of bradycardia due to coughing and gagging on vent/vagal response Unequal pupils noted 2/27, head CT negative on admission       Current Assessment & Plan 09/20/2021 Hospital Encounter Edited 10/22/2021  7:54 AM by Marshell Garfinkel, MD     Maintain neuro protective measures Aspirations precautions  Continue Precedex.  Propofol started yesterday for agitation. Cannot use Precedex due to bradycardia,  Delirium precautions.        Thrombocytopenia due to COVID-19 virus High  Yes    Overview Signed 10/21/2021  8:49 AM by Marshell Garfinkel, MD     DDAVP x1 given 3/1 for dysfunctional platelets       Current Assessment & Plan 10/05/2021 Hospital Encounter Edited 10/22/2021  7:54 AM by Marshell Garfinkel, MD     Monitor CBC.        Palliative care status High  Not Applicable    Current Assessment & Plan 09/25/2021 Hospital Encounter Edited 10/22/2021  7:55 AM by Marshell Garfinkel, MD     Ongoing discussions with significant other who is the decision maker.  I have updated her about his deterioration and poor prognosis for recovery.  Palliative care is on board and appreciate their support He is currently DNR          Best Practice (right click and "Reselect all SmartList Selections" daily)   Diet/type: tubefeeds DVT prophylaxis:  pas due to gib GI prophylaxis: PPI Lines: Dialysis Catheter Foley:  N/A Code Status:  full code Last date of multidisciplinary goals of care discussion: Domestic partner Caren Griffins and sister.  DNR has been issued.  CRRT will be used for limited time to enable fluid removal and facilitate extubation.  Okay for pressors if needed.  They would be agreeable to tracheostomy if prognosis was favorable Appreciate palliative care for family support .  Sister defers to [place] for healthcare decision making  Critical care time:   The patient is critically ill with multiple organ system failure and requires high complexity decision making for assessment and support, frequent evaluation and titration of therapies, advanced monitoring, review of radiographic studies and interpretation of complex data.   Critical Care Time devoted to patient care services, exclusive of separately billable procedures, described in this note is 45 minutes.   Marshell Garfinkel MD [facility] See Amion for pager  If no response to pager , please call [phone] until 7pm After 7:00 pm call Elink  [phone] 10/22/2021, 8:02 AM

## 2021-10-22 NOTE — Progress Notes (Signed)
RT followed up with Jonny Ruiz nurse with CCM. Doctors noted stated the ETT tube is okay with it being 4.9 above the carina. RN aware. ?

## 2021-10-22 NOTE — Plan of Care (Signed)
?  Problem: Coping: ?Goal: Level of anxiety will decrease ?Outcome: Progressing ?  ?Problem: Elimination: ?Goal: Will not experience complications related to urinary retention ?Outcome: Progressing ?  ?Problem: Coping: ?Goal: Psychosocial and spiritual needs will be supported ?Outcome: Progressing ?  ?Problem: Respiratory: ?Goal: Will maintain a patent airway ?Outcome: Progressing ?  ?

## 2021-10-22 NOTE — Progress Notes (Signed)
?[facility] ?Progress Note  ? ? ?Assessment/ Plan:   ? AKI/ azotemia: In the setting of sepsis/ multiple insults.  Making urine.  I suspect hypoperfusion/ ATN.  Likely will end up needing CRRT--> but UOP has picked up in the last few hours after albumin challnge. ?   ?            - azotemia marked- likely GI bleed/ steroids/ catabolic state ?            - started CRRT 3/1 all 4K bath, no heparin (suspect uremic plt dysfunction) ?            - time-limited trial of 48-72 hrs is most appropriate ?            - discussed with partner at bedside 10/17/21, she is in agreement ? - pause CRRT 3/4 pm and follow UOP and labs--> Bun up now, not favorable for recovery.  No indication to restart today.  Following closely ?  ?2.  Acute hypoxic and hypercapneic respiratory failure: intubated, failed extubation 2/27.  Per PCCM.  Respiratory culture growing pseudomonas from 2/27 (unchanged), on levofloxacin ?  ?3.  COVID pneumonia: s/p remdesivir, decadron, antibiotics ?  ?4.  Lower GI bleed- Gi evaluation, flex sig 3/1 unrevealing (poor prep), s/p tagged RBC scan 10/26/2021, repeat flex sig showing rectal ulcer, on cortenema ?  ?5.  Dispo: ICU, greatly appreciate palliative care assistance ? ?Subjective:   ? Making some urine- but rectal bleeding overnight, getting 1 u pRBCs.  Appreciate palliative care assistance.      ? ?Objective:   ?BP (!) 88/63 (BP Location: Left Arm)   Pulse (!) 116   Temp 100.1 ?F (37.8 ?C) (Axillary)   Resp (!) 26   Ht _0  (1.803 m)   Wt 56.9 kg   SpO2 95%   BMI 17.50 kg/m?  ? ?Intake/Output Summary (Last 24 hours) at 10/22/2021 1224 ?Last data filed at 10/22/2021 1200 ?Gross per 24 hour  ?Intake 2069.35 ml  ?Output 1075.2 ml  ?Net 994.15 ml  ? ?Weight change: 0 kg ? ?Physical Exam:   ?GEN ill-appearing, intubated, sedated ?HEENT + temporal wasting ?NECK no JVD ?PULM mech bilaterally ?CV RRR ?ABD soft ?EXT 2+ anasarca is greatly improved ?NEURO intubated, sedated ? ?Imaging: ?DG Chest  Port 1 View ? ?Result Date: 10/22/2021 ?CLINICAL DATA:  Acute respiratory failure. EXAM: PORTABLE CHEST 1 VIEW COMPARISON:  10/21/2021 FINDINGS: There is a right arm PICC line with tip in the projection of the SVC. ET tube tip is above the carina. A feeding tube is identified which courses below the level of the hemidiaphragms and below the field of view. Stable cardiomediastinal contours. Unchanged appearance of bilateral lower lobe airspace opacities. IMPRESSION: 1. Stable support apparatus. 2. No change in bilateral lower lobe airspace opacities. Electronically Signed   By: Kerby Moors M.D.   On: 10/22/2021 08:24  ? ?DG CHEST PORT 1 VIEW ? ?Result Date: 10/21/2021 ?CLINICAL DATA:  Acute respiratory failure. EXAM: PORTABLE CHEST 1 VIEW COMPARISON:  Chest x-ray 10/16/2021. FINDINGS: Endotracheal tube tip is 3.6 cm above the carina. Right upper extremity PICC terminates over the mid SVC. Enteric tube extends below the diaphragm. Left IJ catheter tip projects over the brachiocephalic SVC junction, unchanged. The cardiomediastinal silhouette is within normal limits. Bilateral patchy airspace disease, right greater than left, has slightly decreased on the right. Small left pleural effusion persists. No pneumothorax or acute fracture. IMPRESSION: 1. Mild decrease in bilateral airspace  disease. 2. Endotracheal tube tip 3.6 cm above the carina. Electronically Signed   By: Ronney Asters M.D.   On: 10/21/2021 15:24   ? ?Labs: ?BMET ?Recent Labs  ?Lab 11/14/2021 ?0500 11/17/2021 ?1601 10/20/21 ?0421 10/20/21 ?1733 10/21/21 ?2780 10/21/21 ?1731 10/22/21 ?0447  ?NA 144 139 140 137 136 131* 136  136  ?K 3.2* 3.2* 3.7 3.8 4.2 3.9 4.6  4.6  ?CL 110 106 104 104 105 104 105  105  ?CO2 _0 ?GLUCOSE 104* 136* 132* 118* 117* 222* 89  88  ?BUN 131* 84* 67* 64* 62* 53* 82*  81*  ?CREATININE 2.22* 1.51* 1.13 1.19 1.15 0.92 1.50*  1.41*  ?CALCIUM 8.1* 7.6* 8.0* 8.0* 7.4* 6.7* 7.4*  7.4*  ?PHOS 3.2 2.7 2.5 2.1*  2.0* 1.6* 3.2  3.2  ? ?CBC ?Recent Labs  ?Lab 10/17/21 ?2325 11/10/2021 ?1580 10/20/21 ?0421 10/20/21 ?1955 10/21/21 ?2320 10/22/21 ?0135 10/22/21 ?0747  ?WBC 16.2*   < > 16.2* 18.4* 15.8* 13.9*  --   ?NEUTROABS 15.5*  --   --   --   --   --   --   ?HGB 6.3*   < > 8.5* 8.3* 7.8* 7.5* 9.1*  ?HCT 17.7*   < > 25.1* 24.4* 23.7* 23.0* 27.0*  ?MCV 83.9   < > 87.2 87.5 89.4 90.6  --   ?PLT 152   < > 122* 127* 141* 158  --   ? < > = values in this interval not displayed.  ? ? ?Medications:   ? ? acetylcysteine  2 mL Nebulization QID  ? albuterol  2.5 mg Nebulization QID  ? chlorhexidine gluconate (MEDLINE KIT)  15 mL Mouth Rinse BID  ? Chlorhexidine Gluconate Cloth  6 each Topical Q0600  ? collagenase   Topical Daily  ? docusate  100 mg Per Tube BID  ? docusate  100 mg Per Tube BID  ? feeding supplement (PROSource TF)  90 mL Per Tube BID  ? fentaNYL (SUBLIMAZE) injection  50 mcg Intravenous Once  ? hydrocortisone  100 mg Rectal QHS  ? mouth rinse  15 mL Mouth Rinse 10 times per day  ? nutrition supplement (JUVEN)  1 packet Per Tube BID BM  ? pantoprazole (PROTONIX) IV  40 mg Intravenous Q12H  ? permethrin   Topical Once  ? polyethylene glycol  17 g Per Tube Daily  ? sodium chloride flush  10-40 mL Intracatheter Q12H  ? ? ? ? ?Madelon Lips, MD ?10/22/2021, 12:24 PM   ?

## 2021-10-22 NOTE — Progress Notes (Signed)
Chaplain spent time with Brandon Bond's significant other, engaging in a follow-up visit.  Chaplain provided space for her to continue processing Brandon Bond's health and the decisions she may have to make.  She voiced that she doesn't want Brandon Bond to suffer.  She desires to keep doing all that can be done to possibly get him better while also acknowledging that his body may make the decision to shutdown.   ? ?Chaplain assesses that she has been preparing her mind and spirit for Brandon Bond possibly dying.  She is continuing to hold on to her faith and belief that God may do something different in Brandon Bond life.  She is holding all the possibilities up.  Chaplain affirmed her love and care for Brandon Bond. ? ?Chaplain offered therapeutic listening, compassionate presence, and support. ? ? ? 10/22/21 1700  ?Clinical Encounter Type  ?Visited With Family  ?Visit Type Spiritual support;Follow-up  ?Spiritual Encounters  ?Spiritual Needs Emotional;Grief support  ?Stress Factors  ?Family Stress Factors Loss of control;Major life changes  ? ? ?

## 2021-10-22 NOTE — Progress Notes (Signed)
Subjective: ?Bleeding episodes last night, not today. ? ?Objective: ?Vital signs in last 24 hours: ?Temp:  [98.9 ?F (37.2 ?C)-101.3 ?F (38.5 ?C)] 98.9 ?F (37.2 ?C) (03/05 0530) ?Pulse Rate:  [61-132] 110 (03/05 1100) ?Resp:  [18-35] 28 (03/05 1100) ?BP: (80-129)/(40-84) 100/53 (03/05 1100) ?SpO2:  [87 %-100 %] 96 % (03/05 1100) ?FiO2 (%):  [40 %-80 %] 60 % (03/05 0818) ?Weight:  [56.9 kg] 56.9 kg (03/05 0500) ?Weight change: 0 kg ?Last BM Date : 10/21/21 ? ?PE: ?GEN:  Intubated, frail- and cachectic-appearing ?SKIN:  Scattered ecchymoses ?MSK:  Extensive muscular wasting ?ABD:  Non distended ?NEURO:  Intubated, non-interactive ? ?Lab Results: ?CBC ?   ?Component Value Date/Time  ? WBC 13.9 (H) 10/22/2021 0135  ? RBC 2.54 (L) 10/22/2021 0135  ? HGB 9.1 (L) 10/22/2021 0747  ? HCT 27.0 (L) 10/22/2021 0747  ? PLT 158 10/22/2021 0135  ? MCV 90.6 10/22/2021 0135  ? MCH 29.5 10/22/2021 0135  ? MCHC 32.6 10/22/2021 0135  ? RDW 16.2 (H) 10/22/2021 0135  ? LYMPHSABS 0.3 (L) 10/17/2021 2325  ? MONOABS 0.3 10/17/2021 2325  ? EOSABS 0.0 10/17/2021 2325  ? BASOSABS 0.0 10/17/2021 2325  ?CMP  ?   ?Component Value Date/Time  ? NA 136 10/22/2021 0445  ? NA 136 10/22/2021 0445  ? K 4.6 10/22/2021 0445  ? K 4.6 10/22/2021 0445  ? CL 105 10/22/2021 0445  ? CL 105 10/22/2021 0445  ? CO2 25 10/22/2021 0445  ? CO2 26 10/22/2021 0445  ? GLUCOSE 89 10/22/2021 0445  ? GLUCOSE 88 10/22/2021 0445  ? BUN 82 (H) 10/22/2021 0445  ? BUN 81 (H) 10/22/2021 0445  ? CREATININE 1.50 (H) 10/22/2021 0445  ? CREATININE 1.41 (H) 10/22/2021 0445  ? CALCIUM 7.4 (L) 10/22/2021 0445  ? CALCIUM 7.4 (L) 10/22/2021 0445  ? PROT 4.2 (L) 10/14/2021 0338  ? ALBUMIN 1.7 (L) 10/22/2021 0445  ? AST 25 10/14/2021 0338  ? ALT 27 10/14/2021 0338  ? ALKPHOS 56 10/14/2021 0338  ? BILITOT 0.8 10/14/2021 0338  ? GFRNONAA 53 (L) 10/22/2021 0445  ? GFRNONAA 57 (L) 10/22/2021 0445  ? ?Assessment: ? ? Hematochezia.  Suspected from rectal ulcer (likely stercoral ulcer). ?  Constipation, large rectal fecal burden despite enemas. ?Multiple other critical medical issues (pseudomonas pneumonia, intubated; recent COVID; sepsis syndrome). ? ?Plan: ? ? Would hold off on anticoagulation for at least the next one week; if this is not clinically possible, then very high risk of rebleeding. ?Patient is in absolutely no shape for any further endoscopic/colonoscopic procedures, could not tolerate peroral prep, ngt prep would be very risky. ?Medical management and supportive care alone; no further imaging or specific work-up. ?Prognosis guarded. ?[facility] will sign-off; please call with questions; thank you for the consultation. ? ?Freddy Jaksch ?10/22/2021, 11:26 AM ? ? ?Cell [phone] ?If no answer or after 5 PM call [phone] ? ?

## 2021-10-22 NOTE — Progress Notes (Signed)
RT called CCM about patients ETT tube moving from 26 to 24. RN aware ?

## 2021-10-22 NOTE — Progress Notes (Addendum)
eLink Physician-Brief Progress Note ?Patient Name: Brandon Bond ?DOB: Mar 06, 1961 ?MRN: [id number] ? ? ?Date of Service ? 10/22/2021  ?HPI/Events of Note ? RT said documented ETT was at 26 at the lip, now at 24  ?eICU Interventions ? CXR ordered   ? ? ? ?Intervention Category ?Major Interventions: Respiratory failure - evaluation and management ? ?[name] ?10/22/2021, 7:52 PM ? ?Addendum at 8:30 pm ?Noted CXR, ETT is ok.  ?

## 2021-10-22 NOTE — Progress Notes (Signed)
? ?                                                                                                                                                     ?                                                   ?Daily Progress Note  ? ?Patient Name: Brandon Bond       Date: 10/22/2021 ?DOB: 31-Mar-1961  Age: 61 y.o. MRN#: [id number] ?Attending Physician: Marshell Garfinkel, MD ?Primary Care Physician: Lawerance Cruel, MD ?Admit Date: 09/22/2021 ? ?Reason for Consultation/Follow-up: Establishing goals of care ? ?Subjective: ?Attempts to respond and follow commands, on the vent, back on pressors, GIB last night, 1 unit PRBC ? ?Length of Stay: 12 ? ?Current Medications: ?Scheduled Meds:  ? acetylcysteine  2 mL Nebulization QID  ? albuterol  2.5 mg Nebulization QID  ? chlorhexidine gluconate (MEDLINE KIT)  15 mL Mouth Rinse BID  ? Chlorhexidine Gluconate Cloth  6 each Topical Q0600  ? collagenase   Topical Daily  ? docusate  100 mg Per Tube BID  ? docusate  100 mg Per Tube BID  ? feeding supplement (PROSource TF)  90 mL Per Tube BID  ? fentaNYL (SUBLIMAZE) injection  50 mcg Intravenous Once  ? hydrocortisone  100 mg Rectal QHS  ? mouth rinse  15 mL Mouth Rinse 10 times per day  ? nutrition supplement (JUVEN)  1 packet Per Tube BID BM  ? pantoprazole (PROTONIX) IV  40 mg Intravenous Q12H  ? permethrin   Topical Once  ? polyethylene glycol  17 g Per Tube Daily  ? sodium chloride flush  10-40 mL Intracatheter Q12H  ? ? ?Continuous Infusions: ?  prismasol BGK 4/2.5 Stopped (10/21/21 1702)  ?  prismasol BGK 4/2.5 Stopped (10/21/21 1702)  ? sodium chloride Stopped (10/22/21 0232)  ? feeding supplement (VITAL 1.5 CAL) 45 mL/hr at 10/22/21 1045  ? fentaNYL infusion INTRAVENOUS 75 mcg/hr (10/22/21 1100)  ? levofloxacin (LEVAQUIN) IV    ? norepinephrine (LEVOPHED) Adult infusion 2 mcg/min (10/22/21 1100)  ? prismasol BGK 4/2.5 Stopped (10/21/21 1702)  ? propofol (DIPRIVAN) infusion 25 mcg/kg/min (10/22/21 1100)  ? ? ?PRN Meds: ?acetaminophen  (TYLENOL) oral liquid 160 mg/5 mL, fentaNYL, fentaNYL (SUBLIMAZE) injection, heparin, midazolam, midazolam, ondansetron (ZOFRAN) IV, sodium chloride, sodium chloride flush ? ?Physical Exam         ?On vent ?Appears with generalized weakness ?On pressors ?Has some edema ?Abdomen not distended ?Appears frail ? ?Vital Signs: BP (!) 88/63 (BP Location: Left Arm)   Pulse (!) 116   Temp 100.1 ?F (37.8 ?C) (Axillary)   Resp Marland Kitchen)  26   Ht $R'5\' 11"'FX$  (1.803 m)   Wt 56.9 kg   SpO2 95%   BMI 17.50 kg/m?  ?SpO2: SpO2: 95 % ?O2 Device: O2 Device: Ventilator ?O2 Flow Rate: O2 Flow Rate (L/min): 15 L/min ? ?Intake/output summary:  ?Intake/Output Summary (Last 24 hours) at 10/22/2021 1208 ?Last data filed at 10/22/2021 1100 ?Gross per 24 hour  ?Intake 2045.81 ml  ?Output 1075.2 ml  ?Net 970.61 ml  ? ?LBM: Last BM Date : 10/21/21 ?Baseline Weight: Weight: 63.5 kg ?Most recent weight: Weight: 56.9 kg ? ?     ?Palliative Assessment/Data: ? ? ? ? ? ?Patient Active Problem List  ? Diagnosis Date Noted  ? Lower GI bleed 10/21/2021  ? 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 Acute metabolic encephalopathy 93/73/4287  ? Thrombocytopenia due to COVID-19 virus 10/21/2021  ? Palliative care status 10/21/2021  ? Protein-calorie malnutrition, severe 10/12/2021  ? Pressure injury of skin 10/11/2021  ? Sepsis with acute organ dysfunction without septic shock ([place]) 10/10/2021  ? Community acquired bilateral lower lobe pneumonia 10/10/2021  ? Acute renal failure (ARF) ([place]) 10/10/2021  ? Acute respiratory failure with hypoxia ([place]) 10/10/2021  ? COVID-19 virus infection 10/10/2021  ? 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 ? ?Palliative Care Assessment & Plan  ? ?Patient Profile: ? 61 y.o. male  with past medical history of depression, anxiety, ankle injury admitted on 10/13/2021 with 3 days of altered mental status/weakness and found by EMS to be soiled in stool/urine in recliner. Found to have sepsis, COVID, pseudomonas  pneumonia with ARDS and likely underlying COPD. Hospitalization complicated further by GIB (plans for EGD), acute renal failure, failed extubation. Frail with multiple pressure ulcers present at time of admission. DNR decided 10/26/2021 (family defer decisions for his significant other). Plans for CRRT time trial of 3-4 days. Considering tracheostomy if this is thought to lead to favorable outcome. Overall failure to thrive prior to admission and acute illness. recurrent GI bleed. Requiring pressors.  ? ?Assessment: ? Acute hypoxic resp failure ?Acute renal failure ?GI bleed ?Recent COVID ? ? ?Recommendations/Plan: ? Family meeting with significant other Caren Griffins held at bedside, we reviewed his current condition and his overall goals of care. We talked about his multiple critical illnesses and his high likelihood of ongoing decline, in spite of current interventions. Caren Griffins is very tearful. She wishes to continue with current mode of care for now and will continue to discuss with his parents and sister about his tenuous status. I have given her some information about comfort measures and one-way extubation instead of trach and continued critical measures. She will think about it. PMT to follow.  ?  ? ?Code Status: ? ?  ?Code Status Orders  ?(From admission, onward)  ?  ? ? ?  ? ?  Start     Ordered  ? 11/16/2021 0542  Do not attempt resuscitation (DNR)  Continuous       ?Question Answer Comment  ?In the event of cardiac or respiratory ARREST Do not call a ?code blue?   ?In the event of cardiac or respiratory ARREST Do not perform Intubation, CPR, defibrillation or ACLS   ?In the event of cardiac or respiratory ARREST Use medication by any route, position, wound care, and other measures to relive pain and suffering. May use oxygen, suction and manual treatment of airway obstruction as needed for comfort.   ?  ? 11/15/2021 0541  ? ?  ?  ? ?  ? ?Code  Status History   ? ? Date Active Date Inactive Code Status Order ID Comments  User Context  ? 10/10/2021 0032 11/16/2021 0541 Full Code [id number]  Kristopher Oppenheim, DO ED  ? ?  ? ? ?Prognosis: ? Guarded  ? ?Discharge Planning: ?To Be Determined ? ?Care plan was discussed with  patient's significant other Caren Griffins, RN and renal colleague Dr Hollie Salk.  ? ?Thank you for allowing the Palliative Medicine Team to assist in the care of this patient. ? ? ?Time In:  11.30 Time Out: 12 Total Time 30 Prolonged Time Billed No   ? ?   ?Greater than 50%  of this time was spent counseling and coordinating care related to the above assessment and plan. ? ?Loistine Chance, MD ? ?Please contact Palliative Medicine Team phone at [phone] for questions and concerns.  ? ? ? ? ? ?

## 2021-10-22 NOTE — Progress Notes (Signed)
Pharmacy Antibiotic Note ? ?Brandon Bond is a 61 y.o. male admitted on 10/01/2021. Patient with Covid pneumonia s/p treatment as well as pneumonia s/t pseudomonas initially treated with cefepime. Patient extubated 2/27, but required reintubation later that day due to secretions and weakness.  Pharmacy has been consulted for levofloxacin dosing. ? ?Today, 10/22/21 ?- Remains on the ventilator ?- CRRT on hold since yesterday evening ?- Respiratory culture with pseudomonas (R ceftazidime) ? ?Plan: ?- Change levofloxacin to 250 mg IV q24h based on CrCl 30-50 ml/min ?- Plan for 7 days of treatment per CCMD ?- Follow renal function and Nephrology plans for further antibiotic adjustments ? ?Height: 5\' 11"  (180.3 cm) ?Weight: 56.9 kg (125 lb 7.1 oz) ?IBW/kg (Calculated) : 75.3 ? ?Temp (24hrs), Avg:99.5 ?F (37.5 ?C), Min:98.4 ?F (36.9 ?C), Max:101.3 ?F (38.5 ?C) ? ?Recent Labs  ?Lab 10/16/21 ?2243 10/17/21 ?0333 10/17/21 ?2325 11/14/2021 ?12/18/21 10/21/2021 ?12/18/21 10/19/2021 ?1126  ?1600 Nov 11, 2021 ?0500 11-11-21 ?1601 10/20/21 ?0421 10/20/21 ?1733 10/20/21 ?1955 10/21/21 ?12/21/21 10/21/21 ?1731 10/21/21 ?2320 10/22/21 ?0135 10/22/21 ?0445  ?WBC  --  15.9*   < >  --    < >  --   --  17.1*  --  16.2*  --  18.4*  --   --  15.8* 13.9*  --   ?CREATININE  --  3.33*  --   --    < >  --    < > 2.22*   < > 1.13 1.19  --  1.15 0.92  --   --  1.50*  1.41*  ?LATICACIDVEN 2.2* 2.6*  --  2.5*  --  2.5*  --   --   --   --   --   --   --   --   --   --   --   ? < > = values in this interval not displayed.  ? ?  ?Estimated Creatinine Clearance: 41.6 mL/min (A) (by C-G formula based on SCr of 1.5 mg/dL (H)).   ? ?No Known Allergies ? ?Antimicrobials this admission: ?3/1 levofloxacin >> ?2/27 linezolid x 1 ?2/20 vanc >> 2/21 ?2/20 cefepime >> 2/28 ?2/20 flagyl x 1 ?2/21 RDV >> 2/25 ? ?Microbiology results: ?2/27 RCx: PsA (R ceftazidime) ?2/24 RCx: PsA (pansens) ?2/20 BCx: ngF ?2/20 UCx: ngF ? ?Thank you for allowing pharmacy to be a part of this  patient?s care. ? ?3/20, PharmD, BCPS ?Clinical Pharmacist ?10/22/2021 7:30 AM ? ? ?

## 2021-10-23 ENCOUNTER — Inpatient Hospital Stay (HOSPITAL_COMMUNITY): Payer: 59

## 2021-10-23 DIAGNOSIS — G6281 Critical illness polyneuropathy: Secondary | ICD-10-CM | POA: Diagnosis not present

## 2021-10-23 DIAGNOSIS — N179 Acute kidney failure, unspecified: Secondary | ICD-10-CM | POA: Diagnosis not present

## 2021-10-23 DIAGNOSIS — A419 Sepsis, unspecified organism: Secondary | ICD-10-CM | POA: Diagnosis not present

## 2021-10-23 DIAGNOSIS — R6520 Severe sepsis without septic shock: Secondary | ICD-10-CM | POA: Diagnosis not present

## 2021-10-23 DIAGNOSIS — G9341 Metabolic encephalopathy: Secondary | ICD-10-CM | POA: Diagnosis not present

## 2021-10-23 DIAGNOSIS — Z515 Encounter for palliative care: Secondary | ICD-10-CM | POA: Diagnosis not present

## 2021-10-23 LAB — RENAL FUNCTION PANEL
Albumin: 1.9 g/dL — ABNORMAL LOW (ref 3.5–5.0)
Albumin: 1.8 g/dL — ABNORMAL LOW (ref 3.5–5.0)
Anion gap: 12 (ref 5–15)
Anion gap: 13 (ref 5–15)
BUN: 113 mg/dL — ABNORMAL HIGH (ref 8–23)
BUN: 125 mg/dL — ABNORMAL HIGH (ref 8–23)
CO2: 22 mmol/L (ref 22–32)
CO2: 22 mmol/L (ref 22–32)
Calcium: 7.9 mg/dL — ABNORMAL LOW (ref 8.9–10.3)
Calcium: 7.4 mg/dL — ABNORMAL LOW (ref 8.9–10.3)
Chloride: 107 mmol/L (ref 98–111)
Chloride: 101 mmol/L (ref 98–111)
Creatinine, Ser: 1.59 mg/dL — ABNORMAL HIGH (ref 0.61–1.24)
Creatinine, Ser: 2.07 mg/dL — ABNORMAL HIGH (ref 0.61–1.24)
GFR, Estimated: 49 mL/min — ABNORMAL LOW (ref >60)
GFR, Estimated: 36 mL/min — ABNORMAL LOW (ref >60)
Glucose, Bld: 159 mg/dL — ABNORMAL HIGH (ref 70–99)
Glucose, Bld: 359 mg/dL — ABNORMAL HIGH (ref 70–99)
Phosphorus: 3.8 mg/dL (ref 2.5–4.6)
Phosphorus: 7.3 mg/dL — ABNORMAL HIGH (ref 2.5–4.6)
Potassium: 4.7 mmol/L (ref 3.5–5.1)
Potassium: 5.5 mmol/L — ABNORMAL HIGH (ref 3.5–5.1)
Sodium: 141 mmol/L (ref 135–145)
Sodium: 136 mmol/L (ref 135–145)

## 2021-10-23 LAB — CBC
HCT: 30.4 % — ABNORMAL LOW (ref 39.0–52.0)
Hemoglobin: 10.1 g/dL — ABNORMAL LOW (ref 13.0–17.0)
MCH: 29.5 pg (ref 26.0–34.0)
MCHC: 33.2 g/dL (ref 30.0–36.0)
MCV: 88.9 fL (ref 80.0–100.0)
Platelets: 161 10*3/uL (ref 150–400)
RBC: 3.42 MIL/uL — ABNORMAL LOW (ref 4.22–5.81)
RDW: 16.2 % — ABNORMAL HIGH (ref 11.5–15.5)
WBC: 11.8 10*3/uL — ABNORMAL HIGH (ref 4.0–10.5)
nRBC: 0 % (ref 0.0–0.2)

## 2021-10-23 LAB — BLOOD GAS, ARTERIAL
Acid-base deficit: 1 mmol/L (ref 0.0–2.0)
Bicarbonate: 24.8 mmol/L (ref 20.0–28.0)
Drawn by: 29503
FIO2: 60 %
MECHVT: 600 mL
O2 Saturation: 91.9 %
PEEP: 5 cmH2O
Patient temperature: 37
RATE: 18 resp/min
pCO2 arterial: 45 mmHg (ref 32–48)
pH, Arterial: 7.35 (ref 7.35–7.45)
pO2, Arterial: 59 mmHg — ABNORMAL LOW (ref 83–108)

## 2021-10-23 LAB — COMPREHENSIVE METABOLIC PANEL
ALT: 29 U/L (ref 0–44)
AST: 30 U/L (ref 15–41)
Albumin: 2 g/dL — ABNORMAL LOW (ref 3.5–5.0)
Alkaline Phosphatase: 85 U/L (ref 38–126)
Anion gap: 10 (ref 5–15)
BUN: 90 mg/dL — ABNORMAL HIGH (ref 8–23)
CO2: 24 mmol/L (ref 22–32)
Calcium: 8 mg/dL — ABNORMAL LOW (ref 8.9–10.3)
Chloride: 107 mmol/L (ref 98–111)
Creatinine, Ser: 1.64 mg/dL — ABNORMAL HIGH (ref 0.61–1.24)
GFR, Estimated: 47 mL/min — ABNORMAL LOW (ref >60)
Glucose, Bld: 175 mg/dL — ABNORMAL HIGH (ref 70–99)
Potassium: 4.7 mmol/L (ref 3.5–5.1)
Sodium: 141 mmol/L (ref 135–145)
Total Bilirubin: 1.2 mg/dL (ref 0.3–1.2)
Total Protein: 5.9 g/dL — ABNORMAL LOW (ref 6.5–8.1)

## 2021-10-23 LAB — GLUCOSE, CAPILLARY
Glucose-Capillary: 117 mg/dL — ABNORMAL HIGH (ref 70–99)
Glucose-Capillary: 154 mg/dL — ABNORMAL HIGH (ref 70–99)
Glucose-Capillary: 147 mg/dL — ABNORMAL HIGH (ref 70–99)
Glucose-Capillary: 132 mg/dL — ABNORMAL HIGH (ref 70–99)

## 2021-10-23 LAB — GLUCOSE, RANDOM: Glucose, Bld: 339 mg/dL — ABNORMAL HIGH (ref 70–99)

## 2021-10-23 LAB — MAGNESIUM: Magnesium: 1.9 mg/dL (ref 1.7–2.4)

## 2021-10-23 LAB — PHOSPHORUS: Phosphorus: 4 mg/dL (ref 2.5–4.6)

## 2021-10-23 LAB — TROPONIN I (HIGH SENSITIVITY): Troponin I (High Sensitivity): 2334 ng/L (ref <18)

## 2021-10-23 MED ORDER — FENTANYL BOLUS VIA INFUSION
100.0000 ug | INTRAVENOUS | Status: DC | PRN
  Filled 2021-10-23: qty 100

## 2021-10-23 MED ORDER — GLYCOPYRROLATE 1 MG PO TABS: 1.0000 mg | ORAL_TABLET | ORAL | Status: DC | PRN

## 2021-10-23 MED ORDER — VANCOMYCIN VARIABLE DOSE PER UNSTABLE RENAL FUNCTION (PHARMACIST DOSING)
Status: DC
  Filled 2021-10-23: qty 1

## 2021-10-23 MED ORDER — DIPHENHYDRAMINE HCL 50 MG/ML IJ SOLN: 25.0000 mg | INTRAMUSCULAR | Status: DC | PRN

## 2021-10-23 MED ORDER — PIPERACILLIN-TAZOBACTAM 3.375 G IVPB
3.3750 g | Freq: Three times a day (TID) | INTRAVENOUS | Status: DC
Start: 2021-10-23 — End: 2021-10-24
  Administered 2021-10-23: 3.375 g via INTRAVENOUS
  Filled 2021-10-23 (×2): qty 50

## 2021-10-23 MED ORDER — POLYVINYL ALCOHOL 1.4 % OP SOLN
1.0000 [drp] | Freq: Four times a day (QID) | OPHTHALMIC | Status: DC | PRN
  Filled 2021-10-23: qty 15

## 2021-10-23 MED ORDER — ACETAMINOPHEN 650 MG RE SUPP: 650.0000 mg | Freq: Four times a day (QID) | RECTAL | Status: DC | PRN

## 2021-10-23 MED ORDER — DEXTROSE 5 % IV SOLN: INTRAVENOUS | Status: DC

## 2021-10-23 MED ORDER — FENTANYL CITRATE (PF) 100 MCG/2ML IJ SOLN: 50.0000 ug | INTRAMUSCULAR | Status: DC | PRN

## 2021-10-23 MED ORDER — GLYCOPYRROLATE 0.2 MG/ML IJ SOLN: 0.2000 mg | INTRAMUSCULAR | Status: DC | PRN

## 2021-10-23 MED ORDER — VANCOMYCIN HCL 1250 MG/250ML IV SOLN
1250.0000 mg | Freq: Once | INTRAVENOUS | Status: AC
  Administered 2021-10-23: 1250 mg via INTRAVENOUS
  Filled 2021-10-23: qty 250

## 2021-10-23 MED ORDER — FENTANYL 2500MCG IN NS 250ML (10MCG/ML) PREMIX INFUSION: 0.0000 ug/h | INTRAVENOUS | Status: DC

## 2021-10-23 MED ORDER — VASOPRESSIN 20 UNITS/100 ML INFUSION FOR SHOCK
0.0000 [IU]/min | INTRAVENOUS | Status: DC
  Administered 2021-10-23: 0.03 [IU]/min via INTRAVENOUS
  Filled 2021-10-23: qty 100

## 2021-10-23 MED ORDER — MIDAZOLAM HCL 2 MG/2ML IJ SOLN: 2.0000 mg | INTRAMUSCULAR | Status: DC | PRN

## 2021-10-23 MED ORDER — VANCOMYCIN HCL 750 MG/150ML IV SOLN
750.0000 mg | INTRAVENOUS | Status: DC
Start: 2021-10-24 — End: 2021-10-23

## 2021-10-23 MED ORDER — INSULIN ASPART 100 UNIT/ML IV SOLN
5.0000 [IU] | Freq: Once | INTRAVENOUS | Status: AC
  Administered 2021-10-23: 5 [IU] via INTRAVENOUS

## 2021-10-23 MED ORDER — ACETAMINOPHEN 325 MG PO TABS: 650.0000 mg | ORAL_TABLET | Freq: Four times a day (QID) | ORAL | Status: DC | PRN

## 2021-10-23 MED ORDER — PIPERACILLIN-TAZOBACTAM 3.375 G IVPB 30 MIN
3.3750 g | Freq: Once | INTRAVENOUS | Status: AC
  Administered 2021-10-23: 3.375 g via INTRAVENOUS
  Filled 2021-10-23: qty 50

## 2021-10-23 MED ORDER — LACTATED RINGERS IV BOLUS
1000.0000 mL | Freq: Once | INTRAVENOUS | Status: AC
  Administered 2021-10-23: 1000 mL via INTRAVENOUS

## 2021-10-24 LAB — BLOOD CULTURE ID PANEL (REFLEXED) - BCID2

## 2021-10-25 LAB — CULTURE, RESPIRATORY W GRAM STAIN

## 2021-10-26 LAB — TYPE AND SCREEN
ABO/RH(D): O POS
Antibody Screen: NEGATIVE
Unit division: 0
Unit division: 0

## 2021-10-26 LAB — BPAM RBC
Blood Product Expiration Date: 202304042359
Blood Product Expiration Date: 202304052359
ISSUE DATE / TIME: 202303050251
Unit Type and Rh: 5100
Unit Type and Rh: 5100

## 2021-10-26 LAB — CULTURE, BLOOD (ROUTINE X 2): Special Requests: ADEQUATE

## 2021-10-28 LAB — CULTURE, BLOOD (ROUTINE X 2): Culture: NO GROWTH

## 2021-11-03 DIAGNOSIS — I219 Acute myocardial infarction, unspecified: Secondary | ICD-10-CM

## 2021-11-03 DIAGNOSIS — T797XXA Traumatic subcutaneous emphysema, initial encounter: Secondary | ICD-10-CM

## 2021-11-03 DIAGNOSIS — Z515 Encounter for palliative care: Secondary | ICD-10-CM

## 2021-11-18 NOTE — Death Summary Note (Signed)
?  DEATH SUMMARY  ? ?Patient Details  ?Name: Brandon Bond ?MRN: [id number] ?DOB: [birth year] ? ?Admission/Discharge Information  ? ?Admit Date:  10-28-2021  ?Date of Death: Date of Death: 11/11/2021  ?Time of Death: Time of Death: 2156/11/14  ?Length of Stay: 14  ?Referring Physician: Lawerance Cruel, MD  ? ?Reason(s) for Hospitalization  ?Septic shock ? ?Diagnoses  ?Preliminary cause of death:  ?Septic shock ? ?Secondary Diagnoses (including complications and co-morbidities):  ?Principal Problem: ?  Sepsis with acute organ dysfunction without septic shock ([place]) ?Active Problems: ?  Acute renal failure (ARF) ([facility]) ?  Acute respiratory failure with hypoxia ([place]) ?  COVID-19 virus infection ?  Pressure injury of skin ?  Protein-calorie malnutrition, severe ?  Lower GI bleed ?  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  Acute metabolic encephalopathy ?  Thrombocytopenia due to COVID-19 virus ?  Palliative care status ?  Comfort measures only status ?  Subcutaneous emphysema ([place]) ?  ?Brief Hospital Course (including significant findings, care, treatment, and services provided and events leading to death)  ? ?Brandon Bond is a [age] yow[age] never vax for covid with no reported past medical history who presented to the emergency department due to altered mental status that began 3 days prior to admission.  Per report EMS found patient soiled in stool and urine in her recliner.  He was seen tachycardic and hypoxic with saturation 70% on room air. ?  ?On ED arrival patient met sepsis criteria with acute organ dysfunction.  He was also found to be COVID-positive.  Head CT negative.  CT abdomen pelvis revealed bilateral lower lobe pneumonia.  By morning of 2/23 patient was seen with progressive hypoxia and acidosis despite max BiPAP settings resulting in PCCM consult.  He is admitted to the ICU with the following hospital course ? ?2022/10/28 admitted with altered mental status found to be COVID-positive placed on BiPAP ?2/23 failed BiPAP  resulting in PCCM consult and intubation ?2/24 PEEP dropped to 5 ?2/25 Weaned on pressure support all day, but not extubated due to poor mental status  ?2/26 Bright red blood rectum noted with drop in hemoglobin, bleeding scan negative ?2/27 extubated -reintubated after 8h , unable to clear secretions, hypercarbic ?2/28 Bloody bowel movement and hemoglobin dropped to 6.3 ,Developed bradycardia to 40s , given epinephrine, DNR issued  ?2/28 started CRRT, flex sig -non diagnostic, bleeding scan neg ?3/1 and 3/2 flex sig > rectal ulcer, not bleeding  ?3/5 started Levophed.  CRRT stopped, had episodes of blood per rectum.  Transfuse 1 unit PRBC ?3/6 He continued to deteriorate.  We had multiple family discussions along with palliative care.  On the night of 3/6 he developed subcutaneous emphysema with increasing vent requirements, myocardial infarction on EKG.  His family and significant other elected to make him comfort measures and he was terminally extubated. ?  ?Signature:  ? ?Marshell Garfinkel MD ?[facility] ?11/03/2021, 4:56 PM  ? ? ?

## 2021-11-18 NOTE — Progress Notes (Signed)
Pharmacy Antibiotic Note ? ?Brandon Bond is a 61 y.o. male admitted on 09/29/2021. Patient with COVID pneumonia as well as bacterial PNA (Tracheal aspirate grew pseudomonas aeruginosa - resistant to ceftazidime). Patient has received 5 days of levaquin, pharmacy now consulted to dose vancomycin + piperacillin/tazobactam for sepsis.  ? ?Today, 10/22/21 ?- Remains intubated ?- CRRT stopped on 3/4. SCr 1.6, CrCl ~39 mL/min ?- Tmax 103.3 F ? ?Plan: ?Discontinue levofloxacin ?Piperacillin/tazobactam 3.375 g IV once over 30 mins followed by 3.375 g IV q8h EI ?Vancomycin 1250 mg LD followed by 750 mg IV q24h for estimated AUC of 524 ?Follow renal function and Nephrology plans for further antibiotic adjustments ?Goal vancomycin AUC 400-550. Check levels as needed ? ?Height: 5\' 11"  (180.3 cm) ?Weight: 56.3 kg (124 lb 1.9 oz) ?IBW/kg (Calculated) : 75.3 ? ?Temp (24hrs), Avg:100.2 ?F (37.9 ?C), Min:98.1 ?F (36.7 ?C), Max:103.3 ?F (39.6 ?C) ? ?Recent Labs  ?Lab 10/16/21 ?2243 10/17/21 ?0333 10/17/21 ?2325 10/28/2021 ?12/18/21 11/07/2021 ?12/18/21 10/26/2021 ?1126 11/01/2021 ?1600 10/20/21 ?0421 10/20/21 ?1733 10/20/21 ?1955 10/21/21 ?0436 10/21/21 ?1731 10/21/21 ?2320 10/22/21 ?0135 10/22/21 ?0445 10/22/21 ?1620 10/22/2021 ?0404  ?WBC  --  15.9*   < >  --    < >  --    < > 16.2*  --  18.4*  --   --  15.8* 13.9*  --   --  11.8*  ?CREATININE  --  3.33*  --   --    < >  --    < > 1.13   < >  --  1.15 0.92  --   --  1.50*  1.41* 1.64* 1.59*  1.64*  ?LATICACIDVEN 2.2* 2.6*  --  2.5*  --  2.5*  --   --   --   --   --   --   --   --   --   --   --   ? < > = values in this interval not displayed.  ? ?  ?Estimated Creatinine Clearance: 38.9 mL/min (A) (by C-G formula based on SCr of 1.59 mg/dL (H)).   ? ?No Known Allergies ? ?Antimicrobials this admission: ?3/6 piperacillin/tazobactam >> ?3/6 vanc >> ?3/1 levofloxacin >> 3/6 ?2/27 linezolid x 1 ?2/20 vanc >> 2/21 ?2/20 cefepime >> 2/28 ?2/20 flagyl x 1 ?2/21 RDV >> 2/25 ? ?Microbiology results: ?2/27 RCx:  PsA (R ceftazidime) ?2/24 RCx: PsA (pansens) ?2/20 BCx: ngF ?2/20 UCx: ngF ? ?3/20, PharmD, BCPS ?Clinical Pharmacist ?11/15/2021 8:39 AM ?

## 2021-11-18 NOTE — Progress Notes (Signed)
?[facility] ?Progress Note  ? ? ?Assessment/ Plan:   ? AKI/ azotemia: In the setting of sepsis/ multiple insults.  Making urine.  AKI suspected hypoperfusion causing ATN.   ?            - azotemia was marked- likely GI bleed/ steroids/ catabolic state ?            - started CRRT 3/1 all 4K bath, no heparin  ?            - discussed with partner at bedside 10/17/21, she is in agreement ? - paused CRRT 3/4 pm and no indication to restart today ?- following closely ?  ?2.  Acute hypoxic and hypercapneic respiratory failure: intubated, failed extubation 2/27.  Per PCCM.  Respiratory culture growing pseudomonas from 2/27 (unchanged), on levofloxacin ?  ?3.  COVID pneumonia: s/p remdesivir, decadron, antibiotics ?  ?4.  Lower GI bleed- Gi evaluation, flex sig 3/1 unrevealing (poor prep), s/p tagged RBC scan 10/19/2021, repeat flex sig showing rectal ulcer, on cortenema ?  ?5.  Dispo: ICU, greatly appreciate palliative care assistance ? ?Kelly Splinter, MD ?17-Nov-2021, 6:07 PM ? ? ? ? ?Subjective:   ?Very good UOP 1500 yest and today. Creat stable at 1.6 this am.   ? ?Objective:   ?BP 94/62   Pulse (!) 121   Temp 99.5 ?F (37.5 ?C) (Oral)   Resp 18   Ht $R'5\' 11"'Xs$  (1.803 m)   Wt 56.3 kg   SpO2 92%   BMI 17.31 kg/m?  ? ?Intake/Output Summary (Last 24 hours) at 11/17/2021 1804 ?Last data filed at 2021-11-17 1600 ?Gross per 24 hour  ?Intake 2323.99 ml  ?Output 1425 ml  ?Net 898.99 ml  ? ? ?Weight change: -0.6 kg ? ?Physical Exam:   ?GEN ill-appearing, intubated, sedated ?HEENT + temporal wasting ?NECK no JVD ?PULM mech bilaterally ?CV RRR ?ABD soft ?EXT 2+ anasarca is greatly improved ?NEURO intubated, sedated ? ?Imaging: ?DG CHEST PORT 1 VIEW ? ?Result Date: 10/22/2021 ?CLINICAL DATA:  Endotracheal tube placement. EXAM: PORTABLE CHEST 1 VIEW COMPARISON:  October 22, 2021 (5:24 a.m.) FINDINGS: An endotracheal tube is seen with its distal tip approximately 4.9 cm from the carina. Stable enteric tube, right-sided PICC line and  left internal jugular venous catheter positioning is noted. Predominant stable, moderate to marked severity bilateral lower lobe airspace opacities are seen. There is no evidence of a pleural effusion or pneumothorax. The heart size and mediastinal contours are within normal limits. The visualized skeletal structures are unremarkable. IMPRESSION: 1. Stable endotracheal tube, enteric tube and venous catheter positioning, as described above. 2. Stable moderate to marked severity bilateral lower lobe airspace opacities. Electronically Signed   By: Virgina Norfolk M.D.   On: 10/22/2021 20:18  ? ?DG Chest Port 1 View ? ?Result Date: 10/22/2021 ?CLINICAL DATA:  Acute respiratory failure. EXAM: PORTABLE CHEST 1 VIEW COMPARISON:  10/21/2021 FINDINGS: There is a right arm PICC line with tip in the projection of the SVC. ET tube tip is above the carina. A feeding tube is identified which courses below the level of the hemidiaphragms and below the field of view. Stable cardiomediastinal contours. Unchanged appearance of bilateral lower lobe airspace opacities. IMPRESSION: 1. Stable support apparatus. 2. No change in bilateral lower lobe airspace opacities. Electronically Signed   By: Kerby Moors M.D.   On: 10/22/2021 08:24   ? ?Labs: ?BMET ?Recent Labs  ?Lab 10/20/21 ?1733 10/21/21 ?0436 10/21/21 ?1731 10/22/21 ?0445 10/22/21 ?1620 11/17/21 ?  3014 November 05, 2021 ?1644  ?NA 137 136 131* 136  136 139 141  141 136  ?K 3.8 4.2 3.9 4.6  4.6 4.5 4.7  4.7 5.5*  ?CL 104 105 104 105  105 107 107  107 101  ?CO2 $Rem'26 25 22 25  26 24 22  24 22  'WLiU$ ?GLUCOSE 118* 117* 222* 89  88 155* 159*  175* 359*  ?BUN 64* 62* 53* 82*  81* 95* 113*  90* 125*  ?CREATININE 1.19 1.15 0.92 1.50*  1.41* 1.64* 1.59*  1.64* 2.07*  ?CALCIUM 8.0* 7.4* 6.7* 7.4*  7.4* 7.9* 7.9*  8.0* 7.4*  ?PHOS 2.1* 2.0* 1.6* 3.2  3.2 4.1 3.8  4.0 7.3*  ? ? ?CBC ?Recent Labs  ?Lab 10/17/21 ?2325 11/15/2021 ?9969 10/20/21 ?1955 10/21/21 ?2320 10/22/21 ?0135 10/22/21 ?0747  11/05/21 ?0404  ?WBC 16.2*   < > 18.4* 15.8* 13.9*  --  11.8*  ?NEUTROABS 15.5*  --   --   --   --   --   --   ?HGB 6.3*   < > 8.3* 7.8* 7.5* 9.1* 10.1*  ?HCT 17.7*   < > 24.4* 23.7* 23.0* 27.0* 30.4*  ?MCV 83.9   < > 87.5 89.4 90.6  --  88.9  ?PLT 152   < > 127* 141* 158  --  161  ? < > = values in this interval not displayed.  ? ? ? ?Medications:   ? ? acetylcysteine  2 mL Nebulization QID  ? albuterol  2.5 mg Nebulization QID  ? chlorhexidine gluconate (MEDLINE KIT)  15 mL Mouth Rinse BID  ? Chlorhexidine Gluconate Cloth  6 each Topical Q0600  ? collagenase   Topical Daily  ? docusate  100 mg Per Tube BID  ? feeding supplement (PROSource TF)  90 mL Per Tube BID  ? hydrocortisone  100 mg Rectal QHS  ? mouth rinse  15 mL Mouth Rinse 10 times per day  ? pantoprazole (PROTONIX) IV  40 mg Intravenous Q12H  ? permethrin   Topical Once  ? polyethylene glycol  17 g Per Tube Daily  ? sodium chloride flush  10-40 mL Intracatheter Q12H  ? ? ? ? ?

## 2021-11-18 NOTE — Progress Notes (Signed)
Nutrition Follow-up ? ?DOCUMENTATION CODES:  ? ?Severe malnutrition in context of chronic illness, Underweight ? ?INTERVENTION:  ?- will adjust TF regimen: Vital 1.5 @ 55 ml/hr with 90 ml Prosource TF BID. ?- d/c Juven d/t clinical picture. ?- this regimen will provide 2140 kcal (97% kcal need), 133 grams protein, and 1006 ml free water. ? ? ?NUTRITION DIAGNOSIS:  ? ?Severe Malnutrition related to chronic illness as evidenced by severe fat depletion, severe muscle depletion. -ongoing ? ?GOAL:  ? ?Patient will meet greater than or equal to 90% of their needs -met with TF regimen ? ?MONITOR:  ? ?Vent status, TF tolerance, Labs, Weight trends, Skin ? ?ASSESSMENT:  ? ?61 year old white male with no medical history in our EMR. He presented to the ED due to AMS and weakness x2-3 days. EMS reported that patient was found in a recliner soiled with urine and stool. In the ED, CT head was negative for acute findings and CT chest/abdomen/pelvis showed bilateral lower lobe PNA. ? ?Significant Events: ?2/20- admission ?2/23- intubation; OGT placement; initial RD assessment; initiation of TF ?2/27- extubation; small bore NGT placement (gastric per abdominal x-ray); re-intubated by Anesthesia  ?3/1- CRRT initiation; flex sig ?3/2- repeat flex sig at bedside; TF resumed at 25 ml/hr to increase by 10 ml/hr every 24 hours ?3/4- CRRT stopped ? ? ?Patient discussed in rounds this AM. He remains intubated with small bore NGT in L nare. He is receiving TF at goal rate and one-on-one discussion with RN indicates no issues with TF or tolerance noted this shift. RN reports increasing fever.  ? ?He is receiving Vital 1.5 at goal rate of 55 ml/hr with 90 ml Prosource TF BID and 1 packet Juven BID. This regimen is providing 2330 kcal, 138 grams protein, and 1006 ml free water.  ? ?Weight is beginning to trend back down. Edema severity decreasing and he is now noted to have moderate pitting edema to all extremities. He is now +11.1 L since  admission. ? ?Palliative Care note from yesterday afternoon.  ? ? ?Patient is currently intubated on ventilator support ?MV: 15.1 L/min ?Temp (24hrs), Avg:100.1 ?F (37.8 ?C), Min:98.1 ?F (36.7 ?C), Max:103.3 ?F (39.6 ?C) ?Propofol: 8.5 ml/hr (224 kcal/24 hrs) ?BP: 107/71 and MAP: 83 ? ?Labs reviewed; CBGs: 117 and 154 mg/dl, BUN: 113 mg/dl, creatinine: 1.59 mg/dl, Ca: 7.9 mg/dl, GFR: 49 ml/min. ? ?Medications reviewed; 100 mg colace BID, 40 mg IV protonix BID, 17 g miralax/day. ? ?Drips; propofol @ 25 mcg/kg/min, fentanyl @ 100 mcg/hr, levo @ 8 mcg/min. ? ? ?Diet Order:   ?Diet Order   ? ?       ?  Diet NPO time specified  Diet effective now       ?  ? ?  ?  ? ?  ? ? ?EDUCATION NEEDS:  ? ?No education needs have been identified at this time ? ?Skin:  Skin Assessment: Skin Integrity Issues: ?Skin Integrity Issues:: Stage II, Unstageable, DTI ?DTI: scrotum ?Stage II: R IT; vertebral column ?Unstageable: full thickness to sacrum, L IT, and vertebral column ? ?Last BM:  3/6 (type 6 x1, small amount) ? ?Height:  ? ?Ht Readings from Last 1 Encounters:  ?10/20/2021 $RemoveBe'5\' 11"'[id number]$  (1.803 m)  ? ? ?Weight:  ? ?Wt Readings from Last 1 Encounters:  ?2021-10-27 56.3 kg  ? ? ?BMI:  Body mass index is 17.31 kg/m?. ? ?Estimated Nutritional Needs:  ?Kcal:  2193 kcal ?Protein:  110-137 grams ?Fluid:  >/= 2.2 L/day ? ? ? ? ?  Jarome Matin, MS, RD, LDN ?Inpatient Clinical Dietitian ?RD pager # available in [place]  ?After hours/weekend pager # available in [place] ? ?

## 2021-11-18 NOTE — Progress Notes (Signed)
? ?NAME:  Brandon Bond, MRN:  [id number], DOB:  [birth year], LOS: 16 ?ADMISSION DATE:  09/22/2021, CONSULTATION DATE:  10/12/2021 ?REFERRING MD:  Dr. Aileen Fass, CHIEF COMPLAINT:  Acute respiratory distress   ? ?History of Present Illness:  ?Brandon Bond is a [age] yow[age] never vax for covid with no reported past medical history who presented to the emergency department due to altered mental status that began 3 days prior to admission.  Per report EMS found patient soiled in stool and urine in her recliner.  He was seen tachycardic and hypoxic with saturation 70% on room air. ? ?On ED arrival patient met sepsis criteria with acute organ dysfunction.  He was also found to be COVID-positive.  Head CT negative.  CT abdomen pelvis revealed bilateral lower lobe pneumonia.  By morning of 2/23 patient was seen with progressive hypoxia and acidosis despite max BiPAP settings resulting in PCCM consult ? ?Pertinent  Medical History  ?None ? ?Significant Hospital Events: ?Including procedures, antibiotic start and stop dates in addition to other pertinent events   ?2/20 admitted with altered mental status found to be COVID-positive placed on BiPAP ?2/23 failed BiPAP resulting in PCCM consult and intubation ?2/24 PEEP dropped to 5 ?2/25 Weaned on pressure support all day, but not extubated due to poor mental status  ?2/26 Bright red blood rectum noted with drop in hemoglobin, bleeding scan negative ?2/27 extubated -reintubated after 8h , unable to clear secretions, hypercarbic ?2/28 Bloody bowel movement and hemoglobin dropped to 6.3 ,Developed bradycardia to 40s , given epinephrine, DNR issued  ?2/28 started CRRT, flex sig -non diagnostic, bleeding scan neg ?3/1 and 3/2 flex sig > rectal ulcer, not bleeding  ?3/5 started Levophed.  CRRT stopped, had episodes of blood per rectum.  Transfuse 1 unit PRBC ? ?Interim History / Subjective:  ? ?Remains on norepinephrine.  Looks agonal today ?Febrile ? ?Objective   ?Blood pressure 93/67,  pulse (!) 116, temperature 98.1 ?F (36.7 ?C), temperature source Axillary, resp. rate (!) 28, height _0  (1.803 m), weight 56.3 kg, SpO2 (!) 87 %. ?   ?Vent Mode: PRVC ?FiO2 (%):  [50 %-70 %] 50 % ?Set Rate:  [18 bmp] 18 bmp ?Vt Set:  [600 mL] 600 mL ?PEEP:  [5 cmH20] 5 cmH20 ?Plateau Pressure:  [12 cmH20-27 cmH20] 27 cmH20  ? ?Intake/Output Summary (Last 24 hours) at 11-10-2021 5537 ?Last data filed at November 10, 2021 0600 ?Gross per 24 hour  ?Intake 1639.62 ml  ?Output 1525 ml  ?Net 114.62 ml  ? ?Filed Weights  ? 10/21/21 0424 10/22/21 0500 11-10-2021 0500  ?Weight: 56.9 kg 56.9 kg 56.3 kg  ? ? ?Examination: ?Gen:      No acute distress, chronically ill-appearing, agonal ?HEENT:  EOMI, sclera anicteric ?Neck:     No masses; no thyromegaly ?Lungs:    Clear to auscultation bilaterally; normal respiratory effort ?CV:         Regular rate and rhythm; no murmurs ?Abd:      + bowel sounds; soft, non-tender; no palpable masses, no distension ?Ext:    No edema; adequate peripheral perfusion ?Skin:      Warm and dry; no rash ?Neuro: Unresponsive ? ?Labs/imaging reviewed ?Creatinine 1.59, WBC 11.8, hemoglobin 10.1, platelets 161 ?No new imaging ? ?Resolved Hospital Problem list   ? ?Elevated LFTs  ? 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 ?Assessment & Plan ?Wean as tolerated, intermittent chest x-ray ?He is not a good candidate for tracheostomy ? ?Digestive ?Lower GI bleed ?Assessment & Plan ?Continues to have intermittent blood per rectum. ?GI has been reconsulted.  Holding heparin. ? ? ?Nervous and Auditory ?Acute metabolic encephalopathy ?Assessment & Plan ?Maintain neuro protective measures ?Aspirations precautions  ?Continue fentanyl.  Propofol started yesterday for  agitation. ?Cannot use Precedex due to bradycardia,  ?Delirium precautions. ? 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 ?Will need aggressive PT at some point if he survives this illness. ? ?Genitourinary ?Acute renal failure (ARF) (HCC) ?Assessment & Plan ?Off CRRT and is making some urine ?Monitor labs ? ?Hematopoietic and Hemostatic ?Thrombocytopenia due to COVID-19 virus ?Assessment & Plan ?Monitor CBC. ? ?Other ?Palliative care status ?Assessment & Plan ?Ongoing discussions with significant other who is the decision maker.  I have updated her about his deterioration and poor prognosis for recovery.  Palliative care is on board and appreciate their support ?He is currently DNR. ? ? ?Protein-calorie malnutrition, severe ?Assessment & Plan ?Continue TF per nutrition recs. ? ?Pressure injury of skin ?Assessment & Plan ?Wound care consulted, no need for surgical debridement ?Optimize nutrition. ? ?* Sepsis with acute organ dysfunction without septic shock ([place]) ?Assessment & Plan ?On Levophed ?Wean pressors as tolerated ?On levofloxacin for Pseudomonas in sputum but is now spiking fevers ?Recheck tracheal aspirate and blood culture ?Broaden antibiotics to Vanco, Zosyn ? ? ?  ?  ?Best Practice (right click and "Reselect all SmartList Selections" daily)  ? ?Diet/type: tubefeeds ?DVT prophylaxis:  PAS due to gib ?GI prophylaxis: PPI ?Lines: Dialysis Catheter ?Foley:  N/A ?Code Status:  full code ?Last date of multidisciplinary goals of care discussion: His condition is deteriorating and prognosis is poor. Appreciate palliative care for family support .  Sister defers to significant other Brandon Bond for healthcare decision making ? ?Critical care time:  ? ?The patient is critically ill with multiple organ system failure and requires high complexity decision making for assessment and support, frequent evaluation and titration of therapies, advanced monitoring, review of radiographic studies  and interpretation of complex data.  ? ?Critical Care Time devoted to patient care services, exclusive of separately billable procedures, described in this note is 35 minutes.  ? ?Brandon Garfinkel MD ? Pulmonary & Critical care ?See Amion for pager ? ?If no response to pager , please call [phone] until 7pm ?After 7:00 pm call Elink  [phone] ?10/28/2021, 7:41 AM  ? ?

## 2021-11-18 NOTE — Progress Notes (Signed)
PCCM Interval Progress Note ? ?Met with patient's significant other, Brandon Bond, at bedside along with another family member. ? ?Updated Brandon Bond regarding [name]'s current clinical status and overall worsening condition. Explained that [name] is requiring significantly more support from both a respiratory standpoint with the ventilator (increased PEEP, FiO2) and from a hemodynamic standpoint with increasing pressor needs (uptitration of NE, adding vasopressin).  I explained that often times when patients such as Brandon Bond continue to move in "the wrong direction", it is difficult for them to recover.  Explained that he may continue to require escalation of support and that there will come a time where these interventions are more harmful to Brandon Bond than helpful.  Brandon Bond voiced understanding, stating "we would be doing things TO him and not FOR him".  I agreed with this statement and told Brandon Bond that if the additional interventions/adjustments made today do not keep Brandon Bond in a stable clinical status, it would be time to consider transition to comfort care.   ? ?She would like to continue interventions at this time with the understanding that if Haven's needs significantly escalate tonight, we will contact her with an update.  She also plans to meet with palliative care tomorrow to readdress goals of care.  Advised her that PCCM will update her tomorrow and will remain available for any questions or concerns.  We will contact her overnight if Brandon Bond's condition continues to worsen. ? ?Lestine Mount, PA-C ?[facility] ?Nov 19, 2021 6:07 PM ? ?Please see [email or web address] for pager details. ? ?From 7A-7P if no response, please call [phone] ?After hours, please call ELink [phone] ?

## 2021-11-18 NOTE — Progress Notes (Signed)
Pt extubated per MD rx, with RN present in room. ?

## 2021-11-18 NOTE — Progress Notes (Signed)
Potassium was high at 5.5 so we received orders to give IV insulin 5 units and obtain an EKG. EKG resulted critical for STEMI, E-Link notified. Vital signs stable. ? ?Ottis Stain, RN ?11/04/2021 ?7:02 PM ? ?

## 2021-11-18 NOTE — Progress Notes (Signed)
eLink Physician-Brief Progress Note ?Patient Name: Brandon Bond ?DOB: 1961-04-21 ?MRN: [id number] ? ? ?Date of Service ? 11/12/2021  ?HPI/Events of Note ? Mr Portner continues to deteriorate. He now has developed significant [place] emphysema with pneumomediastinum. No pneumothorax is appreciated at this time. He also has developed EKG changes c/w a possible acute anterior STEMI. Due to vasopressor support and recent severe episodes of GI bleeding, medical therapy for ACS/STEMI is very limited. Spoke with his significant other, [name], who is making medical decisions for Mr. Becvar. She voices that Mr. Kopke should be make comfortable and allowed to pass with comfort and dignity.   ?eICU Interventions ? Plan: ?Will proceed with comfort measures via the PCCM Withdrawal of Life Sustaining Therapy order set.   ? ? ? ?Intervention Category ?Major Interventions: End of life / care limitation discussion ? ?Nikeshia Keetch Dennard Nip ?11/14/2021, 9:01 PM ?

## 2021-11-18 NOTE — Progress Notes (Signed)
? ?                                                                                                                                                     ?                                                   ?Daily Progress Note  ? ?Patient Name: Brandon Bond       Date: 11/22/2021 ?DOB: 02/02/1961  Age: 61 y.o. MRN#: [id number] ?Attending Physician: Marshell Garfinkel, MD ?Primary Care Physician: Lawerance Cruel, MD ?Admit Date: 10/06/2021 ? ?Reason for Consultation/Follow-up: Establishing goals of care ? ?Subjective: ?Not responsive, on vent, on pressors, on propofol, discussed with nursing staff was at bedside.  Patient's significant other Caren Griffins just left the hospital, she is aware of the need to make decisions for the patient and is reportedly considering them.  Appreciate chaplain support. ? ?Length of Stay: 13 ? ?Current Medications: ?Scheduled Meds:  ?? acetylcysteine  2 mL Nebulization QID  ?? albuterol  2.5 mg Nebulization QID  ?? chlorhexidine gluconate (MEDLINE KIT)  15 mL Mouth Rinse BID  ?? Chlorhexidine Gluconate Cloth  6 each Topical Q0600  ?? collagenase   Topical Daily  ?? docusate  100 mg Per Tube BID  ?? feeding supplement (PROSource TF)  90 mL Per Tube BID  ?? hydrocortisone  100 mg Rectal QHS  ?? mouth rinse  15 mL Mouth Rinse 10 times per day  ?? pantoprazole (PROTONIX) IV  40 mg Intravenous Q12H  ?? permethrin   Topical Once  ?? polyethylene glycol  17 g Per Tube Daily  ?? sodium chloride flush  10-40 mL Intracatheter Q12H  ? ? ?Continuous Infusions: ?? sodium chloride 10 mL/hr at 11/22/2021 1400  ?? feeding supplement (VITAL 1.5 CAL) 1,000 mL (11-22-21 1223)  ?? fentaNYL infusion INTRAVENOUS 100 mcg/hr (Nov 22, 2021 1400)  ?? norepinephrine (LEVOPHED) Adult infusion 8 mcg/min (2021-11-22 1400)  ?? piperacillin-tazobactam (ZOSYN)  IV    ?? propofol (DIPRIVAN) infusion 25 mcg/kg/min (November 22, 2021 1400)  ?? [START ON 10/20/2021] vancomycin    ? ? ?PRN Meds: ?acetaminophen (TYLENOL) oral liquid 160 mg/5 mL,  fentaNYL, midazolam, midazolam, ondansetron (ZOFRAN) IV, sodium chloride flush ? ?Physical Exam         ?On vent ?Appears with generalized weakness ?On pressors ?Has some edema ?Abdomen not distended ?Appears frail ?Does not follow commands ?Vital Signs: BP 107/77   Pulse (!) 121   Temp (!) 100.8 ?F (38.2 ?C) (Oral)   Resp (!) 25   Ht $R'5\' 11"'Mw$  (1.803 m)   Wt 56.3 kg   SpO2 97%   BMI 17.31 kg/m?  ?SpO2: SpO2: 97 % ?  O2 Device: O2 Device: Ventilator ?O2 Flow Rate: O2 Flow Rate (L/min): 15 L/min ? ?Intake/output summary:  ?Intake/Output Summary (Last 24 hours) at 10/25/21 1446 ?Last data filed at 10-25-2021 1400 ?Gross per 24 hour  ?Intake 2216.85 ml  ?Output 1525 ml  ?Net 691.85 ml  ? ? ?LBM: Last BM Date : 10/21/21 ?Baseline Weight: Weight: 63.5 kg ?Most recent weight: Weight: 56.3 kg ? ?     ?Palliative Assessment/Data: ? ? ? ? ? ?Patient Active Problem List  ? Diagnosis Date Noted  ?? Lower GI bleed 10/21/2021  ?? 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 Acute metabolic encephalopathy 09/38/1829  ?? Thrombocytopenia due to COVID-19 virus 10/21/2021  ?? Palliative care status 10/21/2021  ?? Protein-calorie malnutrition, severe 10/12/2021  ?? Pressure injury of skin 10/11/2021  ?? Sepsis with acute organ dysfunction without septic shock ([place]) 10/10/2021  ?? Community acquired bilateral lower lobe pneumonia 10/10/2021  ?? Acute renal failure (ARF) ([place]) 10/10/2021  ?? Acute respiratory failure with hypoxia ([place]) 10/10/2021  ?? COVID-19 virus infection 10/10/2021  ?? 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 ? ?Palliative Care Assessment & Plan  ? ?Patient Profile: ? 61 y.o. male  with past medical history of depression, anxiety, ankle injury admitted on 10/06/2021 with 3 days of altered mental status/weakness and found by EMS to be soiled in stool/urine in recliner. Found to have sepsis, COVID, pseudomonas pneumonia with ARDS and likely underlying COPD. Hospitalization complicated further  by GIB (plans for EGD), acute renal failure, failed extubation. Frail with multiple pressure ulcers present at time of admission. DNR decided 11/13/2021 (family defer decisions for his significant other). Plans for CRRT time trial of 3-4 days. Considering tracheostomy if this is thought to lead to favorable outcome. Overall failure to thrive prior to admission and acute illness. recurrent GI bleed. Requiring pressors.  ? ?Assessment: ? Acute hypoxic resp failure ?Acute renal failure ?GI bleed ?Recent COVID ? ? ?Recommendations/Plan: ?On 10-22-2021, family meeting was held with Caren Griffins and we reviewed about the patient's multiple critical illnesses and high likelihood of patient not surviving this hospitalization and spite of current interventions.  Caren Griffins is reportedly considering comfort measures palliative care will follow-up on 10/31/2021.    ? ?Code Status: ? ?  ?Code Status Orders  ?(From admission, onward)  ?  ? ? ?  ? ?  Start     Ordered  ? 11/02/2021 0542  Do not attempt resuscitation (DNR)  Continuous       ?Question Answer Comment  ?In the event of cardiac or respiratory ARREST Do not call a ?code blue?   ?In the event of cardiac or respiratory ARREST Do not perform Intubation, CPR, defibrillation or ACLS   ?In the event of cardiac or respiratory ARREST Use medication by any route, position, wound care, and other measures to relive pain and suffering. May use oxygen, suction and manual treatment of airway obstruction as needed for comfort.   ?  ? 10/19/2021 0541  ? ?  ?  ? ?  ? ?Code Status History   ? ? Date Active Date Inactive Code Status Order ID Comments User Context  ? 10/10/2021 0032 11/12/2021 0541 Full Code [id number]  Kristopher Oppenheim, DO ED  ? ?  ? ? ?Prognosis: ? Guarded  ? ?Discharge Planning: ?Anticipated hospital death ? ?Care plan was discussed with interdisciplinary staff ?Thank you for allowing the Palliative Medicine Team to assist in the care of this patient. ? ? ?Time In: 1400 Time Out:  1425 Total Time 25  Prolonged Time Billed No   ? ?   ?Greater than 50%  of this time was spent counseling and coordinating care related to the above assessment and plan. ? ?Loistine Chance, MD ? ?Please contact Palliative Medicine Team phone at [phone] for questions and concerns.  ? ? ? ? ? ?

## 2021-11-18 NOTE — Progress Notes (Signed)
Time of death pronounced by this RN and Arline Asp, RN 2158. Brandon Bond at bedside with chaplain support ? ?

## 2021-11-18 NOTE — Progress Notes (Signed)
PHARMACY NOTE:  ANTIMICROBIAL RENAL DOSAGE ADJUSTMENT ? ?Current antimicrobial regimen includes a mismatch between antimicrobial dosage and estimated renal function.  As per policy approved by the Pharmacy & Therapeutics and Medical Executive Committees, the antimicrobial dosage will be adjusted accordingly. ? ?Current antimicrobial dosage:  Vancomycin 1250 mg LD followed by 750 mg IV q24h. ? ?Indication: Pneumonia ? ?Renal Function: Estimated Creatinine Clearance: 29.8 mL/min (A) (by C-G formula based on SCr of 2.07 mg/dL (H)). ?[]      On CRRT (d/c on 3/4) ?   ?Antimicrobial dosage has been changed to:  D/C daily vancomycin orders.  Vancomycin dosing per pharmacy based on daily renal function and levels.   ? ? ?Thank you for allowing pharmacy to be a part of this patient's care. ? ?5/4 PharmD, BCPS ?Clinical Pharmacist ?[facility] [phone] ?10/26/2021 7:29 PM ? ?

## 2021-11-18 NOTE — Progress Notes (Signed)
CPT held at this time due to pt condition.  RN in agreement. ?

## 2021-11-18 NOTE — Progress Notes (Deleted)
Pt seen, awake, alert, eating, in no respiratory distress or increased wob.  HR 109, rr21, spo2 97% on room air.  Bipap not indicated at this time. ?

## 2021-11-18 NOTE — Progress Notes (Signed)
Noticed patients neck swelling bilaterally L>R. Right chest swelling and crepitus noted to bilateral chest walls and upper posterior back. Elink notified for orders/interventions. ?

## 2021-11-18 NOTE — Progress Notes (Signed)
eLink Physician-Brief Progress Note ?Patient Name: Brandon Bond ?DOB: 16-Sep-1960 ?MRN: [id number] ? ? ?Date of Service ? 26-Oct-2021  ?HPI/Events of Note ? Nursing calls to report multiple concerns: 1. EKG with possible STEMI - Sinus tachycardia with Premature atrial complexes with Abberant conduction ?Low voltage QRS. Anterior infarct , possibly acute. 2. New subcutaneous tissue crepitus in neck and chest. Difficult situation as he has had multiple episodes of BRBPR on this admission. Therefore, reluctant to start Heparin if Troponins positive. Now on a Norepinephrine IV infusion, therefore, can't B-Block.   ?eICU Interventions ? Plan: ?Portable CXR STAT. ?Cycle Troponin.  ? ? ? ?Intervention Category ?Major Interventions: Other: ? ?Roizy Harold Dennard Nip ?10-26-21, 8:02 PM ?

## 2021-11-18 NOTE — Progress Notes (Signed)
Chaplain continues to offer compassionate care and support to Esmond partner.  She shared that she felt like today would be a harder day than most.  She noted that she has to make some decisions today.  Chaplain affirmed her ability to make the best decisions for [place].  Chaplain uplifted her care and understanding for what [place] may need.  ? ?Chaplain is available to follow-up and offer support. ? ? ? 10/27/2021 1100  ?Clinical Encounter Type  ?Visited With Family  ?Visit Type Spiritual support;Follow-up  ? ? ?

## 2021-11-18 NOTE — Progress Notes (Signed)
Patient transitioned to comfort care. Patient extubated. [place] for comfort. [name], S/O at bedside, hospital chaplain contacted for support. Music playing for patient and [name]. Patient repositioned for comfort. [name] holding patients hand, tearful at this time.  ?

## 2021-11-18 DEATH — deceased

## 2021-12-21 ENCOUNTER — Telehealth: Payer: Self-pay | Admitting: Pulmonary Disease

## 2021-12-21 NOTE — Telephone Encounter (Signed)
On 4/24 we rec'd a letter from [name], the patient's wife, to have a [facility] Critical Illness Statement completed.  Dr. Isaiah Serge completed the form and signed it on 5/3.  I called her today and left a VM that she can pick the form up at the front desk.  No fee charged.  ?

## 2021-12-25 ENCOUNTER — Telehealth: Payer: Self-pay | Admitting: Pulmonary Disease

## 2021-12-25 NOTE — Telephone Encounter (Signed)
Dr Vaughan Browner, did you already sign his death certificate? Family is asking that it list reason for death as covid 19 related.  ?

## 2021-12-26 NOTE — Telephone Encounter (Signed)
I just spoke to the patient's aunt, Shelton Silvas, and they need to have the death certificate updated to include COVID-19 as an additional cause of death. They are attempting to get FEMA assistance to cover funeral expenses and the deadline for submittal is May 11. I sent a message to Dr. Francine Graven asking him to review the chart and update the death certificate if possible.   [place] Vital Records DAVE ID# [id number].   ?

## 2021-12-26 NOTE — Telephone Encounter (Signed)
Patient's representative, [name], called asking about the Critical Illness Statement.  I let her know it is at the front desk waiting to be picked up.  She stated she did not get my voice message on 5/3.  ?

## 2021-12-26 NOTE — Telephone Encounter (Addendum)
Dr. Erin Fulling updated the death certificate to add [id number].  I called Ms. Sheppard back to let her know and also sent her a copy of the confirmation email from [facility].  Email: [email or web address] ?

## 2022-11-04 IMAGING — DX DG CHEST 1V PORT
1 series · 1 of 1 positions shown · non-contrast
Comparison: 10/21/2021

CLINICAL DATA: Acute respiratory failure.

EXAM:
PORTABLE CHEST 1 VIEW

[chest ap]
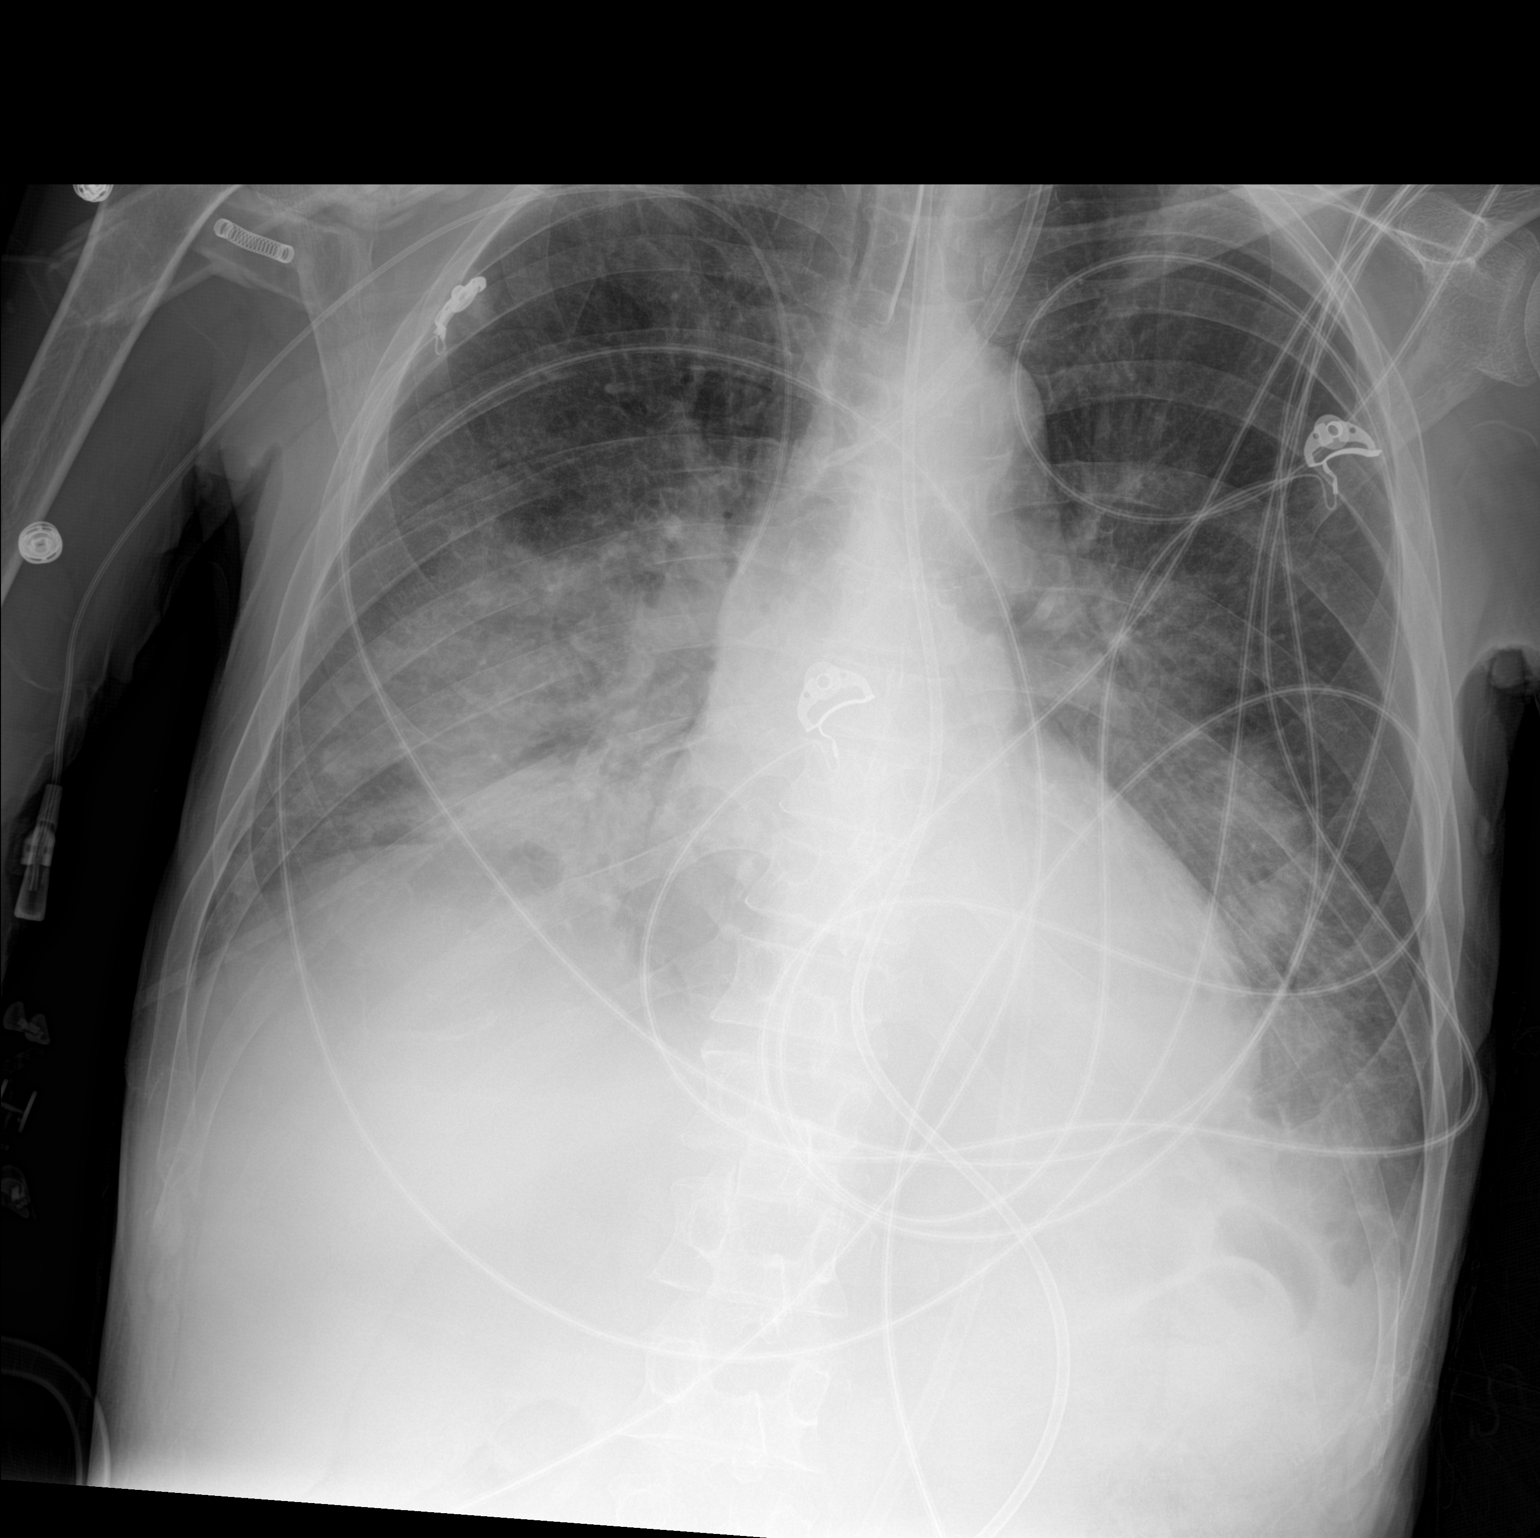

[1 of 1 positions shown; findings below may reference images not displayed]

FINDINGS: There is a right arm PICC line with tip in the projection of the
SVC. ET tube tip is above the carina. A feeding tube is identified
which courses below the level of the hemidiaphragms and below the
field of view. Stable cardiomediastinal contours. Unchanged
appearance of bilateral lower lobe airspace opacities.
IMPRESSION: 1. Stable support apparatus.
2. No change in bilateral lower lobe airspace opacities.

## 2022-11-05 IMAGING — DX DG CHEST 1V PORT
1 series · 1 of 1 positions shown · non-contrast
Comparison: 10/22/2021, CT 10/09/2021

CLINICAL DATA: Crepitus

EXAM:
PORTABLE CHEST 1 VIEW

[chest ap]
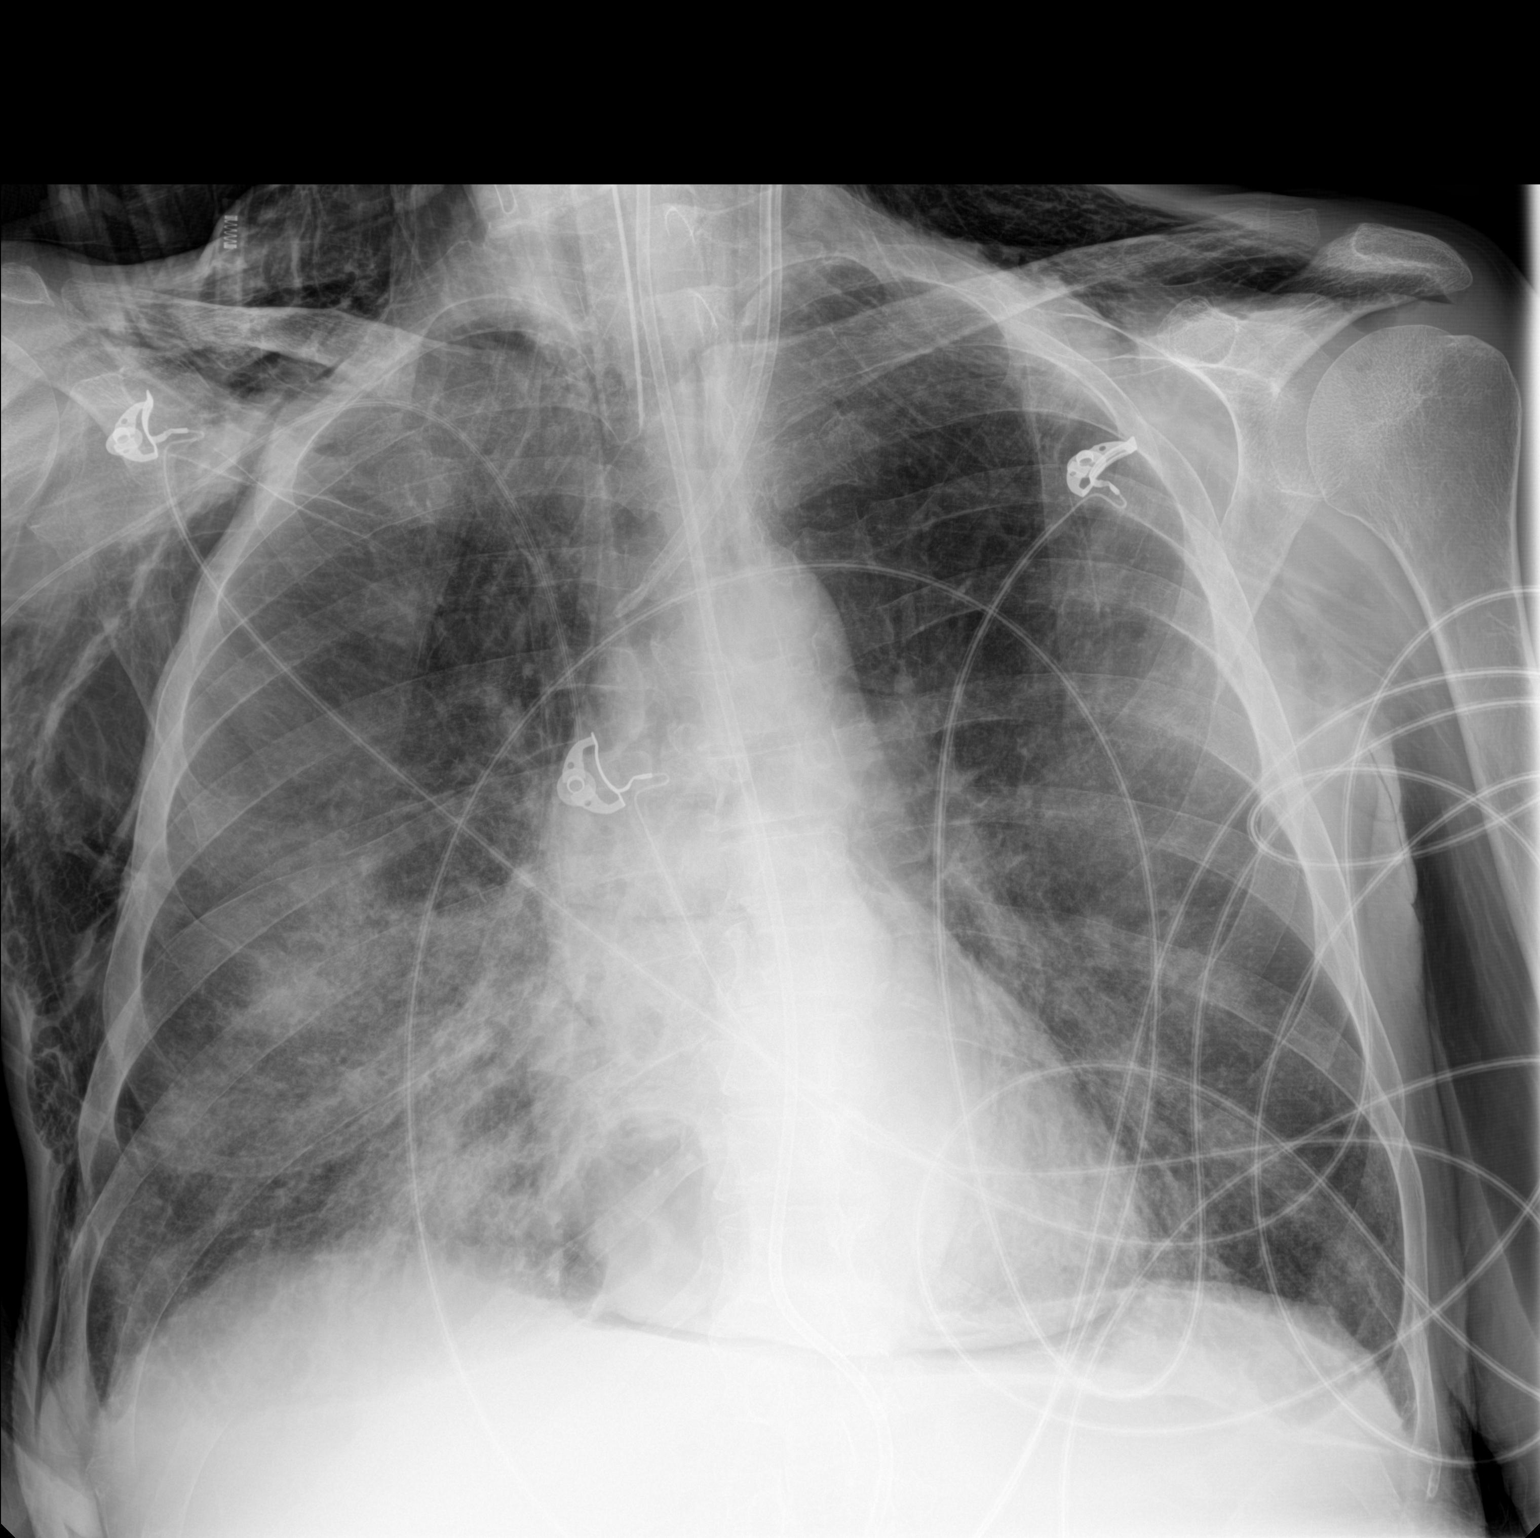

[1 of 1 positions shown; findings below may reference images not displayed]

FINDINGS: Endotracheal tube tip about 7.7 cm superior to the carina. Left IJ
central venous catheter tip over the SVC origin. Right upper
extremity central venous catheter tip over the SVC. Esophageal tube
tip below the diaphragm. Slightly improved aeration of the bases
with better visualized diaphragms. Residual airspace disease in the
right greater than left lower chest. Stable cardiac size. Interim
development of extensive subcutaneous emphysema within the neck and
chest wall. Suspect that there is also pneumomediastinum/probable
pneumopericardium. No definitive pneumothorax allowing for
limitations of extensive subcutaneous air.
IMPRESSION: 1. Support lines and tubes as above
2. Interim development of extensive subcutaneous emphysema within
the neck and chest wall. Suspect pneumomediastinum and small amount
of pneumopericardium.

These results will be called to the ordering clinician or
representative by the Radiologist Assistant, and communication
documented in the PACS or [REDACTED].
